# Patient Record
Sex: Female | Born: 1940 | Race: White | Hispanic: No | Smoking: Never smoker
Health system: Southern US, Community
[De-identification: ages and names within clinical notes are randomized; demographics above are authoritative.]

## PROBLEM LIST (undated history)

## (undated) DIAGNOSIS — I2510 Atherosclerotic heart disease of native coronary artery without angina pectoris: Secondary | ICD-10-CM

## (undated) DIAGNOSIS — M069 Rheumatoid arthritis, unspecified: Secondary | ICD-10-CM

## (undated) DIAGNOSIS — Z87440 Personal history of urinary (tract) infections: Secondary | ICD-10-CM

## (undated) DIAGNOSIS — E785 Hyperlipidemia, unspecified: Secondary | ICD-10-CM

## (undated) DIAGNOSIS — F32A Depression, unspecified: Secondary | ICD-10-CM

## (undated) DIAGNOSIS — I10 Essential (primary) hypertension: Secondary | ICD-10-CM

## (undated) DIAGNOSIS — I509 Heart failure, unspecified: Secondary | ICD-10-CM

## (undated) DIAGNOSIS — I219 Acute myocardial infarction, unspecified: Secondary | ICD-10-CM

## (undated) DIAGNOSIS — F329 Major depressive disorder, single episode, unspecified: Secondary | ICD-10-CM

## (undated) DIAGNOSIS — I255 Ischemic cardiomyopathy: Secondary | ICD-10-CM

## (undated) DIAGNOSIS — F419 Anxiety disorder, unspecified: Secondary | ICD-10-CM

## (undated) DIAGNOSIS — R1310 Dysphagia, unspecified: Secondary | ICD-10-CM

## (undated) DIAGNOSIS — N159 Renal tubulo-interstitial disease, unspecified: Secondary | ICD-10-CM

## (undated) DIAGNOSIS — D649 Anemia, unspecified: Secondary | ICD-10-CM

## (undated) HISTORY — DX: Heart failure, unspecified: I50.9

## (undated) HISTORY — DX: Ischemic cardiomyopathy: I25.5

## (undated) HISTORY — DX: Personal history of urinary (tract) infections: Z87.440

## (undated) HISTORY — DX: Anxiety disorder, unspecified: F41.9

## (undated) HISTORY — DX: Atherosclerotic heart disease of native coronary artery without angina pectoris: I25.10

## (undated) HISTORY — DX: Renal tubulo-interstitial disease, unspecified: N15.9

## (undated) HISTORY — DX: Major depressive disorder, single episode, unspecified: F32.9

## (undated) HISTORY — DX: Hyperlipidemia, unspecified: E78.5

## (undated) HISTORY — DX: Essential (primary) hypertension: I10

## (undated) HISTORY — DX: Acute myocardial infarction, unspecified: I21.9

## (undated) HISTORY — PX: FOOT SURGERY: SHX648

## (undated) HISTORY — DX: Anemia, unspecified: D64.9

## (undated) HISTORY — DX: Dysphagia, unspecified: R13.10

## (undated) HISTORY — DX: Rheumatoid arthritis, unspecified: M06.9

## (undated) HISTORY — DX: Depression, unspecified: F32.A

---
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
## 2008-10-29 ENCOUNTER — Ambulatory Visit: Payer: Self-pay | Admitting: Family Medicine

## 2008-11-19 ENCOUNTER — Encounter: Payer: Self-pay | Admitting: Cardiology

## 2009-06-22 ENCOUNTER — Ambulatory Visit: Payer: Self-pay | Admitting: Cardiology

## 2009-06-22 DIAGNOSIS — I10 Essential (primary) hypertension: Secondary | ICD-10-CM | POA: Insufficient documentation

## 2009-06-28 ENCOUNTER — Ambulatory Visit: Payer: Self-pay

## 2009-06-28 ENCOUNTER — Other Ambulatory Visit: Payer: Self-pay | Admitting: Cardiology

## 2009-06-28 ENCOUNTER — Encounter: Payer: Self-pay | Admitting: Cardiology

## 2009-06-28 ENCOUNTER — Ambulatory Visit: Payer: Self-pay | Admitting: Cardiology

## 2009-06-28 ENCOUNTER — Telehealth: Payer: Self-pay | Admitting: Cardiology

## 2009-07-12 ENCOUNTER — Ambulatory Visit: Payer: Self-pay | Admitting: Cardiology

## 2009-07-28 ENCOUNTER — Encounter: Payer: Self-pay | Admitting: Cardiovascular Disease

## 2009-09-08 ENCOUNTER — Ambulatory Visit: Payer: Self-pay | Admitting: Cardiovascular Disease

## 2009-09-12 ENCOUNTER — Telehealth: Payer: Self-pay | Admitting: Cardiovascular Disease

## 2009-09-28 ENCOUNTER — Ambulatory Visit: Payer: Self-pay | Admitting: Cardiovascular Disease

## 2009-09-28 DIAGNOSIS — E785 Hyperlipidemia, unspecified: Secondary | ICD-10-CM

## 2009-11-15 ENCOUNTER — Ambulatory Visit: Payer: Self-pay | Admitting: Cardiovascular Disease

## 2009-11-16 ENCOUNTER — Telehealth: Payer: Self-pay | Admitting: Cardiovascular Disease

## 2009-12-09 ENCOUNTER — Telehealth: Payer: Self-pay | Admitting: Cardiovascular Disease

## 2009-12-13 ENCOUNTER — Ambulatory Visit: Payer: Self-pay | Admitting: Cardiovascular Disease

## 2009-12-20 ENCOUNTER — Encounter: Payer: Self-pay | Admitting: Cardiovascular Disease

## 2009-12-28 ENCOUNTER — Encounter: Payer: Self-pay | Admitting: Cardiovascular Disease

## 2009-12-28 ENCOUNTER — Telehealth: Payer: Self-pay | Admitting: Cardiovascular Disease

## 2009-12-30 ENCOUNTER — Telehealth: Payer: Self-pay | Admitting: Cardiovascular Disease

## 2010-01-12 ENCOUNTER — Ambulatory Visit: Payer: Self-pay | Admitting: Cardiovascular Disease

## 2010-01-20 ENCOUNTER — Telehealth: Payer: Self-pay | Admitting: Cardiovascular Disease

## 2010-03-26 ENCOUNTER — Encounter: Payer: Self-pay | Admitting: Cardiovascular Disease

## 2010-03-29 ENCOUNTER — Ambulatory Visit: Payer: Self-pay | Admitting: Cardiovascular Disease

## 2010-05-22 ENCOUNTER — Ambulatory Visit: Payer: Self-pay | Admitting: Cardiovascular Disease

## 2010-05-30 ENCOUNTER — Telehealth: Payer: Self-pay | Admitting: Cardiovascular Disease

## 2010-06-08 ENCOUNTER — Telehealth: Payer: Self-pay | Admitting: Cardiovascular Disease

## 2010-07-25 NOTE — Assessment & Plan Note (Signed)
Summary: [facility]   Visit Type:  Follow-up Referring Provider:  wall Primary Provider:  Dr Elease Hashimoto  CC:  been feeling ok, some energy not much, thinks this med makes her feel better than the other medicine. No cp, edema, and or short of breath. .  History of Present Illness: Melody Garrison returns today for evaluation management of her difficult to control hypertension and intolerance to multiple medications.  Previous echo showing  normal left ventricular systolic function, no segmental wall motion abnormalities, moderate LVH, mild mitral regurgitation, and mild left atrial enlargement.   Previously, on her last visit, Melody. Toren had stopped her diltiazem. Her pressures were severely elevated. She's had difficulty tolerating many blood pressure medications. We started her on clonidine 0.1 mg with titration up to 0.2 mg. We have continued her on triamterene HCTZ.  She presents today and states that she has not been taking the triamterene HCTZ as it makes her mouth dry. Her clonidine she has kept at 0.1 mg b.i.d. Blood pressure at home has been typically within the 120-140 range. Other than a dry mouth, she feels well with no significant side effects. She is very happy as she's had difficulty tolerating other medications.  Current Problems (verified): 1)  Hypertension, Unspecified  (ICD-401.9)  Current Medications (verified): 1)  Triamterene-Hctz 37.5-25 Mg Tabs (Triamterene-Hctz) .... 1/2  Tab By Mouth Once Daily - Not Taking Currently 2)  Centrum Silver  Tabs (Multiple Vitamins-Minerals) .Marland Kitchen.. 1 By Mouth Once Daily - On Hold 3)  Bayer Aspirin 325 Mg Tabs (Aspirin) .Marland Kitchen.. 1 By Mouth As Needed For Arthritis 4)  Clonidine Hcl 0.2 Mg Tabs (Clonidine Hcl) .... 1/2  Tablet By Mouth Twice A Day  Allergies (verified): 1)  ! * Anti Inflamatories 2)  ! * Metoprolol 3)  ! * Amlodipine 4)  ! * Lisinopril 5)  ! * Bystolic 6)  ! Diovan  Past History:  Past Medical History: Last updated:  06-27-2009 Anxiety Arthritis Depression Eye Ulcers Hypertension (April 2010) kidney infections  Past Surgical History: Last updated: 27-Jun-2009 foot surgery  Family History: Last updated: June 27, 2009 Father: deceased 47; MI Mother: deceased 71; MI first one at age 30 Brother: heart problems  Social History: Last updated: 06/27/09 Retired  Widowed  Tobacco Use - No.  Alcohol Use - no Regular Exercise - no Drug Use - no  Risk Factors: Alcohol Use: 0 (2009-06-27) Caffeine Use: yes 2 cups a day (06-27-09) Exercise: no (27-Jun-2009)  Risk Factors: Smoking Status: never (06-27-09)  Review of Systems  The patient denies fever, weight loss, weight gain, vision loss, decreased hearing, hoarseness, chest pain, syncope, dyspnea on exertion, peripheral edema, prolonged cough, abdominal pain, incontinence, muscle weakness, depression, and enlarged lymph nodes.         Dry mouth  Vital Signs:  Patient profile:   70 year old female Height:      65 inches Weight:      150.50 pounds BMI:     25.14 Pulse rate:   70 / minute Pulse rhythm:   regular BP sitting:   118 / 72  (left arm) Cuff size:   regular  Vitals Entered By: Melody Garrison (September 28, 2009 10:16 AM)  Physical Exam  General:  well-appearing  woman in no apparent distress, HEENT exam is benign, oropharynx is clear, neck is supple with no JVP or carotid bruits, heart sounds are regular S1 and S2 and no murmurs appreciated, lungs are clear to auscultation bilaterally, abdominal exam is benign, she  has no  lower extremity edema, pulses are equal and symmetrical in upper and lower extremities, skin is warm and dry. Neurologic exam is grossly nonfocal.   Impression & Recommendations:  Problem # 1:  HYPERTENSION, UNSPECIFIED (ICD-401.9) her blood pressure is well controlled on today's visit and in a reasonable range based on her home measurements. We have encouraged her to continue on the clonidine 0.1 mg  b.i.d. and monitor her blood pressure at home. She will call us or Dr. Elease Hashimoto if it starts trending upward. If she does have elevated measurement that stays elevated, she could take an additional 0.1 mg tablet that day.   Her updated medication list for this problem includes:    Triamterene-hctz 37.5-25 Mg Tabs (Triamterene-hctz) .Marland Kitchen... 1/2  tab by mouth once daily - not taking currently    Bayer Aspirin 325 Mg Tabs (Aspirin) .Marland Kitchen... 1 by mouth as needed for arthritis    Clonidine Hcl 0.2 Mg Tabs (Clonidine hcl) .Marland Kitchen... 1/2  tablet by mouth twice a day  Problem # 2:  HEALTH SCREENING (ICD-V70.0) She is currently on aspirin daily. She does not smoke.  Problem # 3:  HYPERLIPIDEMIA-MIXED (ICD-272.4) She does have high cholesterol based on recent blood work provided by Dr. Dorthy Cooler from February. Her cholesterol is 226, LDL 137, HDL 71. Vitamin D is low at 22.  We talked with her about starting a low-dose generic cholesterol medication. She would like to wait at this time she has had significant problems with muscle ache/myalgia. We will discuss with her on her next visit.

## 2010-07-25 NOTE — Assessment & Plan Note (Signed)
Summary: F1M/AMD   Visit Type:  Follow-up Referring Provider:  wall Primary Provider:  Dr Elease Hashimoto  CC:  "Doing well"..  History of Present Illness: Ms Kingbird returns today for evaluation management of her difficult to control hypertension and intolerance to multiple medications.  she was recently seen at [facility] with referral to Dr. Luciana Axe for a retro-retinal bleed. She lost part of her vision thought secondary to her severe hypertension. She has taken herself off numerous other medications including metoprolol, bystolic, lisinopril, HCTZ, Diovan. we had started her on Valturna 300/320 mg and she has been able to tolerate this with no problems. Her pressures continued to be elevated and we had suggested over the phone she continue with clonidine 0.1 mg b.i.d.  Today she reports that she has taken herself off clonidine. She reports blood pressures at home that were in the 130s to 140s systolic. Today her pressure in the office is well above that. She is quite surprised. She is not taking any of her anxiety medicines. She is nervous about what the side effects might be.  Previous echo showing  normal left ventricular systolic function, no segmental wall motion abnormalities, moderate LVH, mild mitral regurgitation, and mild left atrial enlargement.   Current Medications (verified): 1)  Bayer Aspirin 325 Mg Tabs (Aspirin) .Marland Kitchen.. 1 By Mouth As Needed For Arthritis 2)  Valturna 300-320 Mg Tabs (Aliskiren-Valsartan) .... Take 1 Tablet By Mouth Once A Day 3)  Centrum Silver  Tabs (Multiple Vitamins-Minerals) .Marland Kitchen.. 1 Once Daily  Allergies (verified): 1)  ! * Anti Inflamatories 2)  ! * Metoprolol 3)  ! * Amlodipine 4)  ! * Lisinopril 5)  ! * Bystolic 6)  ! Diovan  Past History:  Past Medical History: Last updated: July 06, 2009 Anxiety Arthritis Depression Eye Ulcers Hypertension (April 2010) kidney infections  Past Surgical History: Last updated: 07-06-09 foot  surgery  Family History: Last updated: Jul 06, 2009 Father: deceased 66; MI Mother: deceased 72; MI first one at age 39 Brother: heart problems  Social History: Last updated: 06-Jul-2009 Retired  Widowed  Tobacco Use - No.  Alcohol Use - no Regular Exercise - no Drug Use - no  Risk Factors: Alcohol Use: 0 (Jul 06, 2009) Caffeine Use: yes 2 cups a day (07-06-09) Exercise: no (07-06-09)  Risk Factors: Smoking Status: never (07/06/09)  Review of Systems  The patient denies fever, weight loss, weight gain, vision loss, decreased hearing, hoarseness, chest pain, syncope, dyspnea on exertion, peripheral edema, prolonged cough, abdominal pain, incontinence, muscle weakness, depression, and enlarged lymph nodes.         arthritis  Vital Signs:  Patient profile:   70 year old female Height:      65 inches Weight:      148 pounds BMI:     24.72 Pulse rate:   83 / minute BP sitting:   181 / 84  (left arm) Cuff size:   regular  Vitals Entered By: Bishop Dublin, CMA (January 12, 2010 10:17 AM)  Serial Vital Signs/Assessments:  Time      Position  BP       Pulse  Resp  Temp     By 10:28 AM            182/80                         Bishop Dublin, CMA   Physical Exam  General:  Well developed, well nourished, in no acute distress. Head:  normocephalic and atraumatic Neck:  Neck supple, no JVD. No masses, thyromegaly or abnormal cervical nodes. Lungs:  Clear bilaterally to auscultation and percussion. Heart:  Non-displaced PMI, chest non-tender; regular rate and rhythm, S1, S2 without murmurs, rubs or gallops. Carotid upstroke normal, no bruit. Pedals normal pulses. No edema, no varicosities. Abdomen:  Bowel sounds positive; abdomen soft and non-tender without masses, organomegaly, or hernias noted. No hepatosplenomegaly. Msk:  Back normal, normal gait. Muscle strength and tone normal. 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 Pulses:  pulses normal in all 4 extremities Extremities:   No clubbing or cyanosis. Neurologic:  Alert and oriented x 3. Skin:  Intact without lesions or rashes. Psych:  Normal affect.   Impression & Recommendations:  Problem # 1:  HYPERTENSION, UNSPECIFIED (ICD-401.9) blood pressure is very elevated in the office and again she attributes this to white coat syndrome. We have asked her to closely monitor her blood pressure at home who also test her blood pressure at pharmacies around the area. If her blood pressure is greater than 150 systolic, she is to take hydralazine 50 mg p.r.n.  The following medications were removed from the medication list:    Clonidine Hcl 0.1 Mg Tabs (Clonidine hcl) .Marland Kitchen... Take 1 tablet by mouth once a day as needed for bp greater than 150 on top number Her updated medication list for this problem includes:    Bayer Aspirin 325 Mg Tabs (Aspirin) .Marland Kitchen... 1 by mouth as needed for arthritis    Valturna 300-320 Mg Tabs (Aliskiren-valsartan) .Marland Kitchen... Take 1 tablet by mouth once a day    Hydralazine Hcl 50 Mg Tabs (Hydralazine hcl) .Marland Kitchen... Take one tablet by mouth three times a day as needed for bp greater than 150/90  Orders: EKG w/ Interpretation (93000)  Problem # 2:  HYPERLIPIDEMIA-MIXED (ICD-272.4) We'll try to obtain her most recent lipid panel for our records. She is on aspirin. She is a nonsmoker.  Patient Instructions: 1)  Your physician has recommended you make the following change in your medication: START hydralazine 50mg  three times a day as needed for BP greater than 150/90 2)  Your physician wants you to follow-up in:  6 months  You will receive a reminder letter in the mail two months in advance. If you don't receive a letter, please call our office to schedule the follow-up appointment. 3)  Your physician has requested that you regularly monitor and record your blood pressure readings at home.  Please use the same machine at the same time of day to check your readings and call us with the results in 1 week.   Prescriptions: VALTURNA 300-320 MG TABS (ALISKIREN-VALSARTAN) Take 1 tablet by mouth once a day  #35 x 0   Entered by:   Benedict Needy, RN   Authorized by:   Dossie Arbour MD   Signed by:   Dossie Arbour MD on 01/12/2010   Method used:   Samples Given   RxID:   [id number] HYDRALAZINE HCL 50 MG TABS (HYDRALAZINE HCL) Take one tablet by mouth three times a day as needed for BP greater than 150/90  #90 x 4   Entered by:   Benedict Needy, RN   Authorized by:   Dossie Arbour MD   Signed by:   Benedict Needy, RN on 01/12/2010   Method used:   [facility]ically to        [facility]  [address] [phone][facility] (retail)       [address]  [place], [place]  [address]       Ph: [phone]       Fax: [phone]   RxID:   [id number]

## 2010-07-25 NOTE — Progress Notes (Signed)
Summary: BP check  Phone Note Other Incoming   Summary of Call: BP check: 147/71, 163/66, 146/69, 148/70, 143/71 Initial call taken by: Charlena Cross, RN, BSN,  June 28, 2009 12:31 PM     Appended Document: BP check No change in meds.  Reviewed Juanito Doom, MD

## 2010-07-25 NOTE — Assessment & Plan Note (Signed)
Summary: [facility]   Visit Type:  rov Referring Provider:  wall Primary Provider:  Dr Elease Hashimoto  CC:  no complaints, diltiazem it has done such a number on her head, and she hasn't taken it for the pass 3-4 days - made her vision blurry.  she just couldn't function on the medication. Marland Kitchen  History of Present Illness: Ms Rottman returns today for evaluation management of her difficult to control hypertension and intolerance to multiple medications.  Because of abnormal EKG, previous abnormal echo, and poorly controlled hypertension, we obtained an echocardiogram shows normal left ventricular systolic function, no segmental wall motion abnormalities, moderate LVH, mild mitral regurgitation, and mild left atrial enlargement.  Ms. Wynn Banker stopped taking her diltiazem 3-4 days ago. She also cut her diuretic medication in half. She felt that it was doing something funny with her head, causing blurry vision and headaches. She states that she could not function on the medication. She reports having blood pressures in the 150s systolic at home and was surprised that her pressure was still elevated on today's visit. She states that she feels well today and wants to go out to lunch with her friends.  Current Problems (verified): 1)  Hypertension, Unspecified  (ICD-401.9)  Current Medications (verified): 1)  Triamterene-Hctz 37.5-25 Mg Tabs (Triamterene-Hctz) .... 1/2  Tab By Mouth Once Daily 2)  Centrum Silver  Tabs (Multiple Vitamins-Minerals) .Marland Kitchen.. 1 By Mouth Once Daily - On Hold 3)  Bayer Aspirin 325 Mg Tabs (Aspirin) .Marland Kitchen.. 1 By Mouth As Needed For Arthritis  Allergies (verified): 1)  ! * Anti Inflamatories 2)  ! * Metoprolol 3)  ! * Amlodipine 4)  ! * Lisinopril 5)  ! * Bystolic 6)  ! Diovan  Past History:  Past Medical History: Last updated: 07-16-09 Anxiety Arthritis Depression Eye Ulcers Hypertension (April 2010) kidney infections  Past Surgical History: Last updated:  07-16-2009 foot surgery  Family History: Last updated: 16-Jul-2009 Father: deceased 30; MI Mother: deceased 52; MI first one at age 6 Brother: heart problems  Social History: Last updated: 16-Jul-2009 Retired  Widowed  Tobacco Use - No.  Alcohol Use - no Regular Exercise - no Drug Use - no  Risk Factors: Alcohol Use: 0 (Jul 16, 2009) Caffeine Use: yes 2 cups a day (2009/07/16) Exercise: no (2009/07/16)  Risk Factors: Smoking Status: never (2009/07/16)  Review of Systems  The patient denies fever, weight loss, weight gain, vision loss, decreased hearing, hoarseness, chest pain, syncope, dyspnea on exertion, peripheral edema, prolonged cough, abdominal pain, incontinence, muscle weakness, depression, and enlarged lymph nodes.         arthritis pain in hands and legs  Vital Signs:  Patient profile:   70 year old female Height:      65 inches Weight:      149 pounds BMI:     24.88 Pulse rate:   84 / minute Pulse rhythm:   regular BP sitting:   220 / 92  (left arm) Cuff size:   regular  Vitals Entered By: Mercer Pod (September 08, 2009 11:25 AM)  Physical Exam  General:  well-appearing elderly woman in no apparent distress, HEENT exam is benign, neck is supple with no JVP or carotid bruits, heart sounds are regular with S1-S2 and no murmurs appreciated, lungs are clear to auscultation with no wheezes or rales, abdominal exam is benign, no significant lower extremity edema, neurologic exam is grossly nonfocal, skin is warm and dry.   Impression & Recommendations:  Problem # 1:  HYPERTENSION,  UNSPECIFIED (ICD-401.9) blood pressure is very elevated with systolics in the 200 range on recheck. She is asymptomatic. She seems to change her medications very frequently and has discontinued her calcium channel blocker and only takes half the HCTZ.  I've encouraged her to call us when she does change her medications on her own. She is reluctant to restart her diltiazem. He  notes indicate having problems with metoprolol, amlodipine, lisinopril, bystolic, and Diovan. Most likely her medication side effects or not appropriate as she seems unable to her unwilling to take medications in general.  We will start her on clonidine 0.1 mg b.i.d. and if she is able to tolerate this, titrate the dose to 0.2 mg b.i.d. We'll see her back in 2 weeks time. If she is unable to tolerate clonidine, we could try hydralazine.   The following medications were removed from the medication list:    Diltiazem Hcl Cr 180 Mg Xr24h-cap (Diltiazem hcl) .Marland Kitchen... 1 tab by mouth daily Her updated medication list for this problem includes:    Triamterene-hctz 37.5-25 Mg Tabs (Triamterene-hctz) .Marland Kitchen... 1/2  tab by mouth once daily    Bayer Aspirin 325 Mg Tabs (Aspirin) .Marland Kitchen... 1 by mouth as needed for arthritis    Clonidine Hcl 0.2 Mg Tabs (Clonidine hcl) .Marland Kitchen... Take one tablet by mouth twice a day  Patient Instructions: 1)  Your physician recommends that you schedule a follow-up appointment in: 2 week 2)  Your physician has recommended you make the following change in your medication: clonidine 0.2 mg twice a day (start by taking 1/2 tablet) Prescriptions: CLONIDINE HCL 0.2 MG TABS (CLONIDINE HCL) Take one tablet by mouth twice a day  #60 x 3   Entered by:   Charlena Cross, RN, BSN   Authorized by:   Dossie Arbour MD   Signed by:   Charlena Cross, RN, BSN on 09/08/2009   Method used:   [facility] to        [phone][facility] (retail)       [address]       [place], [place]  [address]       Ph: [phone]       Fax: [phone]   RxID:   [id number]

## 2010-07-25 NOTE — Assessment & Plan Note (Signed)
Summary: ROV/GLC   Visit Type:  Follow-up Referring Provider:  wall Primary Provider:  Dr Elease Hashimoto  CC:  c/o being nervous with some of medications.  She is depressed, dizzy, diarrhea, and indigestion and muscle aches..  History of Present Illness: Ms Dlugosz returns today for evaluation management of her difficult to control hypertension and intolerance to multiple medications, also with history of rheumatoid arthritis. 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 She has taken herself off numerous other medications including metoprolol, bystolic, lisinopril, HCTZ, Diovan. we has been maintained on Valturna 300/320 mg and she has been able to tolerate this with no problems.  she presents today and reports having significant anxiety. Her aunt has been sleeping with her as she is very nervous. She notices that her gait is unstable and wonders if it might be the medication. She would like to try a half dose of her medication to see if this helps. She typically states that at home her blood pressure is well controlled and reports numbers in the 120s to 140 range systolic with diastolics in the 60s to 70s. She reports having this checked at facilities outside of her house as well as with her home blood pressure cuff.  she does have trouble walking due to severe foot pain. She is unable to exercise to any degree. She also has significant hand pain secondary to her rheumatoid arthritis.  Typically in our office, her blood pressure is never well controlled though she reports it is because she is always nervous.  Previous echo showing  normal left ventricular systolic function, no segmental wall motion abnormalities, moderate LVH, mild mitral regurgitation, and mild left atrial enlargement.   Current Medications (verified): 1)  Valturna 300-320 Mg Tabs (Aliskiren-Valsartan) .... Take 1 Tablet By Mouth Once A Day 2)  Centrum Silver  Tabs  (Multiple Vitamins-Minerals) .Marland Kitchen.. 1 Once Daily  Allergies (verified): 1)  ! * Anti Inflamatories 2)  ! * Metoprolol 3)  ! * Amlodipine 4)  ! * Lisinopril 5)  ! * Bystolic 6)  ! Diovan  Past History:  Past Medical History: Last updated: June 25, 2009 Anxiety Arthritis Depression Eye Ulcers Hypertension (April 2010) kidney infections  Past Surgical History: Last updated: 2009-06-25 foot surgery  Family History: Last updated: 06/25/2009 Father: deceased 22; MI Mother: deceased 52; MI first one at age 62 Brother: heart problems  Social History: Last updated: 2009/06/25 Retired  Widowed  Tobacco Use - No.  Alcohol Use - no Regular Exercise - no Drug Use - no  Risk Factors: Alcohol Use: 0 (06/25/09) Caffeine Use: yes 2 cups a day (06-25-2009) Exercise: no (Jun 25, 2009)  Risk Factors: Smoking Status: never (06/25/2009)  Review of Systems       The patient complains of difficulty walking.  The patient denies fever, weight loss, weight gain, vision loss, decreased hearing, hoarseness, chest pain, syncope, dyspnea on exertion, peripheral edema, prolonged cough, abdominal pain, incontinence, muscle weakness, depression, and enlarged lymph nodes.         Hand pain, severe foot pain  Vital Signs:  Patient profile:   70 year old female Height:      65 inches Weight:      153 pounds BMI:     25.55 Pulse rate:   97 / minute BP sitting:   192 / 82  (left arm) Cuff size:   large  Vitals Entered By: Bishop Dublin, CMA (March 29, 2010 3:19 PM)  Serial  Vital Signs/Assessments:  Time      Position  BP       Pulse  Resp  Temp     By                     188/84   92                    Bishop Dublin, CMA   Physical Exam  General:  Well developed, well nourished, in no acute distress. Head:  normocephalic and atraumatic Neck:  Neck supple, no JVD. No masses, thyromegaly or abnormal cervical nodes. Lungs:  Clear bilaterally to auscultation and percussion. Heart:   Non-displaced PMI, chest non-tender; regular rate and rhythm, S1, S2 without murmurs, rubs or gallops. Carotid upstroke normal, no bruit. Pedals normal pulses. No edema, no varicosities. Abdomen:  Bowel sounds positive; abdomen soft and non-tender without masses Msk:  Back normal, normal gait. Muscle strength and tone normal. 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 Pulses:  pulses normal in all 4 extremities Extremities:  No clubbing or cyanosis. Neurologic:  Alert and oriented x 3. Skin:  Intact without lesions or rashes. Psych:  Normal affect.   Impression & Recommendations:  Problem # 1:  HYPERTENSION, UNSPECIFIED (ICD-401.9) she reports well-controlled blood pressures at home and has notes detailing this. She has severe hypertension in the office. She has been reluctant to take any supplemental medications and has problems with almost every medication we tried. As she reports adequate blood pressures, we have not made any changes at this time. Yesterday continue to monitor her blood pressure very closely.  The following medications were removed from the medication list:    Bayer Aspirin 325 Mg Tabs (Aspirin) .Marland Kitchen... 1 by mouth as needed for arthritis    Hydralazine Hcl 50 Mg Tabs (Hydralazine hcl) .Marland Kitchen... Take one tablet by mouth three times a day as needed for bp greater than 150/90 Her updated medication list for this problem includes:    Valturna 300-320 Mg Tabs (Aliskiren-valsartan) .Marland Kitchen... Take 1 tablet by mouth once a day  Problem # 2:  HYPERLIPIDEMIA-MIXED (ICD-272.4) No cholesterol medications have been started as she has significant problems with almost every medication.  Patient Instructions: 1)  Your physician recommends that you continue on your current medications as directed. Please refer to the Current Medication list given to you today. 2)  Your physician wants you to follow-up in:   6 months You will receive a reminder letter in the mail two months in advance. If you don't  receive a letter, please call our office to schedule the follow-up appointment. 3)  Your physician has requested that you regularly monitor and record your blood pressure readings at home. Please call office in 1 week with recordings.   Appended Document: ROV/GLC given her problems with walking, using her hands to drive, I suggested that she use a handicap sticker. She does have signs of mild depression and is very anxious at night. I hope with the sticker, this would encourage her to get out of the house and socialize.

## 2010-07-25 NOTE — Letter (Signed)
Summary: Self-Recorded Vitals & Medical Hx  Self-Recorded Vitals & Medical Hx   Imported By: Marylou Mccoy 05/03/2010 09:31:21  _____________________________________________________________________  External Attachment:    Type:   Image     Comment:   External Document  Appended Document: Self-Recorded Vitals & Medical Hx Blood pressure loks good. Would confirm as well on alternat BP cuff. Other symptoms sound like anxiety ?Marland Kitchen May want to talk with PMD. Need low dose xanax? Hydralazine ok for Blood vessels. Used safely for HTN. Could cut in 1/2 if needed for HTN  Appended Document: Self-Recorded Vitals & Medical Hx Attempted to call pt.  LMOM TCB. EWJ  Appended Document: Self-Recorded Vitals & Medical Hx Called spoke with pt adised of Dr Windell Hummingbird recommendations.  Pt agrees and will continue to monitor BP and call with problems. EWJ

## 2010-07-25 NOTE — Assessment & Plan Note (Signed)
Summary: ROV/AMD   Visit Type:  Follow-up Primary Provider:  Dr Elease Hashimoto  CC:  no cp, no sob, and no edema.  History of Present Illness: Melody Garrison returns today for evaluation management of her difficult to control hypertension and intolerance to multiple medications.  Because of abnormal EKG, previous abnormal echo, and poorly controlled hypertension, we obtained an echocardiogram shows normal left ventricular systolic function, no segmental Delmore Sear motion abnormalities, moderate LVH, mild mitral regurgitation, and mild left atrial enlargement.  I placed her on a low dose of diltiazem extended release and convinced her that she needed her diuretic daily. Her pressures been running around 140/60 at time of heart rates in the 60s. She says she feels better. Lower extremity edema has not been an issue.  Current Problems (verified): 1)  Hypertension, Unspecified  (ICD-401.9)  Current Medications (verified): 1)  Triamterene-Hctz 37.5-25 Mg Tabs (Triamterene-Hctz) .Marland Kitchen.. 1 Tab By Mouth Once Daily 2)  Centrum Silver  Tabs (Multiple Vitamins-Minerals) .Marland Kitchen.. 1 By Mouth Once Daily 3)  Bayer Aspirin 325 Mg Tabs (Aspirin) .Marland Kitchen.. 1 By Mouth As Needed For Arthritis 4)  Diltiazem Hcl Cr 180 Mg Xr24h-Cap (Diltiazem Hcl) .Marland Kitchen.. 1 Tab By Mouth Daily  Allergies (verified): 1)  ! * Anti Inflamatories 2)  ! * Metoprolol 3)  ! * Amlodipine 4)  ! * Lisinopril 5)  ! * Bystolic 6)  ! Diovan  Past History:  Past Medical History: Last updated: 07-17-2009 Anxiety Arthritis Depression Eye Ulcers Hypertension (April 2010) kidney infections  Past Surgical History: Last updated: 07-17-09 foot surgery  Family History: Last updated: Jul 17, 2009 Father: deceased 82; MI Mother: deceased 49; MI first one at age 48 Brother: heart problems  Social History: Last updated: 07-17-2009 Retired  Widowed  Tobacco Use - No.  Alcohol Use - no Regular Exercise - no Drug Use - no  Risk Factors: Alcohol Use: 0  (Jul 17, 2009) Caffeine Use: yes 2 cups a day (07/17/09) Exercise: no (2009/07/17)  Risk Factors: Smoking Status: never (Jul 17, 2009)  Review of Systems       negative other than history of present illness  Vital Signs:  Patient profile:   70 year old female Height:      65 inches Weight:      146.25 pounds BMI:     24.43 Pulse rate:   76 / minute Pulse rhythm:   regular BP sitting:   180 / 86  (right arm) Cuff size:   regular  Vitals Entered By: Mercer Pod (July 12, 2009 11:44 AM)  Physical Exam  General:  Well developed, well nourished, in no acute distress. Head:  normocephalic and atraumatic Eyes:  PERRLA/EOM intact; conjunctiva and lids normal. Neck:  Neck supple, no JVD. No masses, thyromegaly or abnormal cervical nodes. Lungs:  Clear bilaterally to auscultation and percussion. Heart:  Non-displaced PMI, chest non-tender; regular rate and rhythm, S1, S2 without murmurs, rubs or gallops. Carotid upstroke normal, no bruit. Normal abdominal aortic size, no bruits. Femorals normal pulses, no bruits. Pedals normal pulses. No edema, no varicosities. Msk:  decreased ROM.   Pulses:  pulses normal in all 4 extremities Extremities:  trace left pedal edema and trace right pedal edema.   Neurologic:  Alert and oriented x 3. Skin:  Intact without lesions or rashes. Psych:  anxious.     Impression & Recommendations:  Problem # 1:  HYPERTENSION, UNSPECIFIED (ICD-401.9) Assessment Improved her Vent blood pressure has significantly improved range around 140/60 with heart rates in the 60s when she checks it  on. There seems to be a component of whitecoat hypertension. With her left ventricular hypertrophy on the echocardiogram and EKG, it is clear however that her blood pressures been up a lot more than down. With her tolerating diltiazem and a diuretic we'll continue with both. I have made no changes. She'll check her pressure about once or twice a month. I'll see her back  in 6 months. Her updated medication list for this problem includes:    Triamterene-hctz 37.5-25 Mg Tabs (Triamterene-hctz) .Marland Kitchen... 1 tab by mouth once daily    Bayer Aspirin 325 Mg Tabs (Aspirin) .Marland Kitchen... 1 by mouth as needed for arthritis    Diltiazem Hcl Cr 180 Mg Xr24h-cap (Diltiazem hcl) .Marland Kitchen... 1 tab by mouth daily  Patient Instructions: 1)  Your physician recommends that you schedule a follow-up appointment in: 6 months 2)  Your physician recommends that you continue on your current medications as directed. Please refer to the Current Medication list given to you today. Prescriptions: DILTIAZEM HCL CR 180 MG XR24H-CAP (DILTIAZEM HCL) 1 tab by mouth daily  #30 x 6   Entered by:   Charlena Cross, RN, BSN   Authorized by:   Gaylord Shih, MD, [facility]   Signed by:   Charlena Cross, RN, BSN on 07/12/2009   Method used:   Print then Give to Patient   RxID:   [id number]

## 2010-07-25 NOTE — Assessment & Plan Note (Signed)
Summary: ROV/AMD   Visit Type:  Follow-up Referring Provider:  wall Primary Provider:  Dr. Elease Hashimoto  CC:   Denies SOB and chest pain. " I have questions about Valturna".  History of Present Illness: Ms Grassi returns today for evaluation management of her difficult to control hypertension and intolerance to multiple medications, also with history of rheumatoid arthritis. 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 She has taken herself off numerous other medications including metoprolol, bystolic, lisinopril, HCTZ, Diovan. we has been maintained her on Valturna 300/320.  previously, she reported having no problems with valturna. Today, she reports that it is making her anxiety worse. She has significant anxiety when she is at home alone. She has been cutting the pill in half and even into a quarter. She would like to change medication. She wonders if the symptoms could be secondary to the Diovan. she has tried medications for anxiety in the past. she has not been able to tolerate these.  she does have trouble walking due to severe foot pain. She is unable to exercise to any degree. She also has significant hand pain secondary to her rheumatoid arthritis.  Typically in our office, her blood pressure is never well controlled though she reports it is because she is always nervous.   Previous echo showing  normal left ventricular systolic function, no segmental wall motion abnormalities, moderate LVH, mild mitral regurgitation, and mild left atrial enlargement.   Current Medications (verified): 1)  Valturna 300-320 Mg Tabs (Aliskiren-Valsartan) .... Take 1 Tablet By Mouth Once A Day 2)  Centrum Silver  Tabs (Multiple Vitamins-Minerals) .Marland Kitchen.. 1 Once Daily  Allergies (verified): 1)  ! * Anti Inflamatories 2)  ! * Metoprolol 3)  ! * Amlodipine 4)  ! * Lisinopril 5)  ! * Bystolic 6)  ! Diovan  Past History:  Past Medical  History: Last updated: July 01, 2009 Anxiety Arthritis Depression Eye Ulcers Hypertension (April 2010) kidney infections  Past Surgical History: Last updated: 2009-07-01 foot surgery  Family History: Last updated: Jul 01, 2009 Father: deceased 6; MI Mother: deceased 67; MI first one at age 82 Brother: heart problems  Social History: Last updated: 2009-07-01 Retired  Widowed  Tobacco Use - No.  Alcohol Use - no Regular Exercise - no Drug Use - no  Risk Factors: Alcohol Use: 0 (Jul 01, 2009) Caffeine Use: yes 2 cups a day (2009-07-01) Exercise: no (July 01, 2009)  Risk Factors: Smoking Status: never (07-01-2009)  Review of Systems       The patient complains of difficulty walking.  The patient denies fever, weight loss, weight gain, vision loss, decreased hearing, hoarseness, chest pain, syncope, dyspnea on exertion, peripheral edema, prolonged cough, abdominal pain, incontinence, muscle weakness, depression, and enlarged lymph nodes.         anxiety, joint pain  Vital Signs:  Patient profile:   70 year old female Height:      65 inches Weight:      151.50 pounds BMI:     25.30 Pulse rate:   100 / minute BP sitting:   151 / 82  (left arm) Cuff size:   regular  Vitals Entered By: Lysbeth Galas CMA (May 22, 2010 10:57 AM)  Physical Exam  General:  Well developed, well nourished, in no acute distress. disfigurement of the joints of her hands Head:  normocephalic and atraumatic Neck:  Neck supple, no JVD. No masses, thyromegaly or abnormal cervical nodes. Lungs:  Clear bilaterally to auscultation and  percussion. Abdomen:  Bowel sounds positive; abdomen soft and non-tender without masses Msk:  Back normal, normal gait. Muscle strength and tone normal. 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 Pulses:  pulses normal in all 4 extremities Extremities:  No clubbing or cyanosis. Neurologic:  Alert and oriented x 3. Skin:  Intact without lesions or rashes. Psych:  Normal  affect.   Impression & Recommendations:  Problem # 1:  HYPERTENSION, UNSPECIFIED (ICD-401.9) we will change her valturna at her request to Tekturna 300 mg daily. She can use hydralazine p.r.n. if her blood pressure does climb. Estrogen even touch with our office with blood pressure measurements through the next week or so.  Her updated medication list for this problem includes:    Valturna 300-320 Mg Tabs (Aliskiren-valsartan) .Marland Kitchen... Take 1 tablet by mouth once a day hold for now    Tekturna 300 Mg Tabs (Aliskiren fumarate) ..... One tablet once daily  Problem # 2:  HYPERLIPIDEMIA-MIXED (ICD-272.4) We will try to obtain her most recent lipid panel from Dr. Elease Hashimoto.  She is off of aspirin secondary to recent bleeding in her eye. Asked her to contact the ophthalmologist to determine when she can restart her low-dose aspirin.  Patient Instructions: 1)  Your physician recommends that you schedule a follow-up appointment in: 6 months. 2)  Your physician has recommended you make the following change in your medication: Start Tekturna 300 mg one tablet once daily.  Appended Document: ROV/AMD EKG shows normal sinus rhythm with rate 100 beats per minute, rare PVC. No significant ST or T wave changes. Voltage in the limb leads concerning for LVH.  Appended Document: ROV/AMD her heart rate didn't discharge her clinical exam. We will continue to monitor her heart rate. She is intolerant of beta blockers

## 2010-07-25 NOTE — Progress Notes (Signed)
Summary: BP NUMBERS  Phone Note Call from Patient Call back at Home Phone [phone]   Caller: Patient Call For: RN Summary of Call: PATIENT CALLED TO GIVE BP RESULTS THURS MORNING 132/74 AND THURS. AFTERNOON 132/77.  FRIDAY 125/74 Initial call taken by: West Carbo,  December 30, 2009 4:41 PM  Follow-up for Phone Call        looks good per Dr. Mariah Milling Follow-up by: Benedict Needy, RN,  December 30, 2009 5:43 PM

## 2010-07-25 NOTE — Assessment & Plan Note (Signed)
Summary: ROV/GLC   Visit Type:  rov Referring Provider:  wall Primary Provider:  Dr Elease Hashimoto   History of Present Illness: Melody Garrison returns today for evaluation management of her difficult to control hypertension and intolerance to multiple medications.  she states that today she was very nervous because it was suggested that she might need an EKG. She knew that she had an EKG in January and did not need one. She has taken her clonidine today and states that at home it has been well controlled with systolic pressures in the 130 to 140 range.  initial screening blood pressure suggests systolic of 180. She is perplexed as it has been doing so well. She attributed to being nervous.otherwise she feels well and has no complaints of lightheadedness, dizziness, shortness of breath. She realizes that she has a problem with anxiety. She has been given Paxil but has not started it. She has Ativan though rarely takes it and she states that the full dose made her feel too sleepy.  Previous echo showing  normal left ventricular systolic function, no segmental wall motion abnormalities, moderate LVH, mild mitral regurgitation, and mild left atrial enlargement.   Current Medications (verified): 1)  Centrum Silver  Tabs (Multiple Vitamins-Minerals) .Marland Kitchen.. 1 By Mouth Once Daily - On Hold 2)  Bayer Aspirin 325 Mg Tabs (Aspirin) .Marland Kitchen.. 1 By Mouth As Needed For Arthritis 3)  Clonidine Hcl 0.2 Mg Tabs (Clonidine Hcl) .... 1/2  Tablet By Mouth Twice A Day  Allergies: 1)  ! * Anti Inflamatories 2)  ! * Metoprolol 3)  ! * Amlodipine 4)  ! * Lisinopril 5)  ! * Bystolic 6)  ! Diovan  Past History:  Past Medical History: Last updated: 2009/07/17 Anxiety Arthritis Depression Eye Ulcers Hypertension (April 2010) kidney infections  Past Surgical History: Last updated: 07/17/2009 foot surgery  Family History: Last updated: 07/17/2009 Father: deceased 89; MI Mother: deceased 25; MI first one at age  17 Brother: heart problems  Social History: Last updated: 2009/07/17 Retired  Widowed  Tobacco Use - No.  Alcohol Use - no Regular Exercise - no Drug Use - no  Risk Factors: Alcohol Use: 0 (07/17/2009) Caffeine Use: yes 2 cups a day (07-17-09) Exercise: no (07/17/09)  Risk Factors: Smoking Status: never (07-17-09)  Review of Systems  The patient denies fever, weight loss, weight gain, vision loss, decreased hearing, hoarseness, chest pain, syncope, dyspnea on exertion, peripheral edema, prolonged cough, abdominal pain, incontinence, muscle weakness, depression, and enlarged lymph nodes.    Vital Signs:  Patient profile:   70 year old female Height:      65 inches Weight:      151 pounds BMI:     25.22 Pulse rate:   92 / minute Pulse rhythm:   regular BP sitting:   185 / 92  (left arm) Cuff size:   large  Vitals Entered By: Danielle Rankin, CMA (Nov 15, 2009 3:24 PM)  Physical Exam  General:  Well developed, well nourished, in no acute distress. Head:  normocephalic and atraumatic Mouth:  Teeth, gums and palate normal. Oral mucosa normal. Neck:  Neck supple, no JVD. No masses, thyromegaly or abnormal cervical nodes. Lungs:  Clear bilaterally to auscultation and percussion. Heart:  Non-displaced PMI, chest non-tender; regular rate and rhythm, S1, S2 without murmurs, rubs or gallops. Carotid upstroke normal, no bruit. Normal abdominal aortic size, no bruits. Femorals normal pulses, no bruits. Pedals normal pulses. No edema, no varicosities. Abdomen:  Bowel sounds positive; abdomen  soft and non-tender without masses, organomegaly, or hernias noted. No hepatosplenomegaly. Msk:  Back normal, normal gait. Muscle strength and tone normal. Pulses:  pulses normal in all 4 extremities Extremities:  No clubbing or cyanosis. Neurologic:  Alert and oriented x 3. Skin:  Intact without lesions or rashes. Psych:  appears slightly anxious. Otherwise normal affect.    Impression  & Recommendations:  Problem # 1:  HYPERTENSION, UNSPECIFIED (ICD-401.9) once again her blood pressure continues to be a big issue. She is taking clonidine 0.1 mg b.i.d. and states that it has been well controlled at home. Blood pressure today is very elevated though she states this is due to anxiety. If she is in fact having reasonable blood pressures at home as she was on her last visit to the office when she was on clonidine, she may have a significant anxiety problem. I suggested she try Ativan 0.5 mg p.r.n., Toprol immediately and take her clonidine, recheck her pressures 2 hours after that and if it is still elevated with systolics greater than 160, take an additional clonidine. She will call me in the morning after she takes her a.m. clonidine.  If her pressure continues to be elevated, we will start hydralazine 50 mg t.i.d. She's had difficulty tolerating almost all other medications.  The following medications were removed from the medication list:    Triamterene-hctz 37.5-25 Mg Tabs (Triamterene-hctz) .Marland Kitchen... 1/2  tab by mouth once daily - not taking currently Her updated medication list for this problem includes:    Bayer Aspirin 325 Mg Tabs (Aspirin) .Marland Kitchen... 1 by mouth as needed for arthritis    Clonidine Hcl 0.2 Mg Tabs (Clonidine hcl) .Marland Kitchen... 1/2  tablet by mouth twice a day

## 2010-07-25 NOTE — Progress Notes (Signed)
Summary: BP NUMBERS  Phone Note Call from Patient Call back at Home Phone [phone]   Caller: Patient Call For: Nazareth Kirk Summary of Call: PATIENT CALLED WITH WEEKS BP NUMBERS: 143/75, 121/67, 144/72, 142/71, 142/69, AND 143/72 Initial call taken by: West Carbo,  January 20, 2010 2:25 PM  Follow-up for Phone Call        BP's are improved. Please advise.  Follow-up by: Benedict Needy, RN,  January 20, 2010 2:30 PM  Additional Follow-up for Phone Call Additional follow up Details #1::        loks much better. Keep on same meds

## 2010-07-25 NOTE — Consult Note (Signed)
Summary: Consultation Report  Consultation Report   Imported By: West Carbo 12/15/2009 12:20:21  _____________________________________________________________________  External Attachment:    Type:   Image     Comment:   External Document

## 2010-07-25 NOTE — Progress Notes (Signed)
Summary: HEMORRAGE IN EYE  Phone Note Call from Patient Call back at [phone]   Caller: TIM (BROTHER) Call For: [facility] Summary of Call: PT HAS A HEMORRAGE IN HER EYE-WOULD LIKE TO KNOW IF SHE CAN BE SEEN TODAY Initial call taken by: Harlon Flor,  December 09, 2009 11:09 AM  Follow-up for Phone Call        Attempted to call back.  LMOM TCB. Cloyde Reams RN  December 09, 2009 2:42 PM   Spoke with pt's brother Jorja Loa.  Pt went to opthamologist today had eye examined and has had a significant bleed which the eye dr felt was secondary to elevated BP.  BP today in MD office was 186/96.  Attempted to call pt to find out what BP meds pt is currently on.  # busy.  Follow-up by: Cloyde Reams RN,  December 09, 2009 3:14 PM  Additional Follow-up for Phone Call Additional follow up Details #1::        Spoke with pt, she has been taking Clonidine 0.2mg  1/2 tablet two times a day, pt states Dr Mariah Milling advised her she could incr clonidine to 0.2mg  1 tablet two times a day, and pt states she is going to do this today.  Pt will continue to monitor BP and call back with elevation.  Pt cannot drive at present per eye MD, but made pt f/u appt on 12/28/09 per pt request to f/u on BP. Additional Follow-up by: Cloyde Reams RN,  December 09, 2009 3:43 PM

## 2010-07-25 NOTE — Progress Notes (Signed)
Summary: Office Visit  Office Visit   Imported By: West Carbo 12/20/2009 11:36:05  _____________________________________________________________________  External Attachment:    Type:   Image     Comment:   External Document

## 2010-07-25 NOTE — Letter (Signed)
Summary: Historic Patient File  Historic Patient File   Imported By: West Carbo 12/28/2009 16:27:57  _____________________________________________________________________  External Attachment:    Type:   Image     Comment:   External Document  Appended Document: Historic Patient File pt has bladder infection and since starting abx BP has improved.  Asked pt to take BP's today and tomorrow and call me tomorrow afternoon.  Per Dr. Mariah Milling pt can take clonidine 0.1 mg two times a day as needed for BP >140/90

## 2010-07-25 NOTE — Progress Notes (Signed)
Summary: BP READINGS  Phone Note Call from Patient Call back at Home Phone [phone]   Caller: SELF Call For: [facility] Summary of Call: BP READINGS: 3-18 139/61;  3-19 120/55;  3-20 158/69;  3-21  143/57 Initial call taken by: Harlon Flor,  September 12, 2009 1:48 PM  Follow-up for Phone Call        informed pt that BP readings were good.  pt to call back if BP begins trending upward.   Follow-up by: Charlena Cross, RN, BSN,  September 12, 2009 1:51 PM

## 2010-07-25 NOTE — Progress Notes (Signed)
Summary: REFILL Valturna  Phone Note Refill Request   Refills Requested: Medication #1:  VALTURNA 300-320 MG TABS Take 1 tablet by mouth once a day. [facility]     Prescriptions: VALTURNA 300-320 MG TABS (ALISKIREN-VALSARTAN) Take 1 tablet by mouth once a day  #30 x 3   Entered by:   Bishop Dublin, CMA   Authorized by:   Dossie Arbour MD   Signed by:   Bishop Dublin, CMA on 12/28/2009   Method used:   Electronically to        [facility] [phone]* (retail)       [address]       [place], [place]  [address]       Ph: [phone]       Fax: [phone]   RxID:   [id number] VALTURNA 300-320 MG TABS (ALISKIREN-VALSARTAN) Take 1 tablet by mouth once a day  #30 x 3   Entered by:   Bishop Dublin, CMA   Authorized by:   Dossie Arbour MD   Signed by:   Bishop Dublin, CMA on 12/28/2009   Method used:   Electronically to        [facility] [address] (retail)       [address]       [place], [place]  [address]       Ph: [phone]       Fax: [phone]   RxID:   [id number]   Appended Document: REFILL Colleen Can with [facility] to cancel refill on Valturna she is getting [facility]

## 2010-07-25 NOTE — Progress Notes (Signed)
Summary: BP READING  Phone Note Call from Patient Call back at [phone]   Caller: SELF Call For: [facility] Summary of Call: BP IS 158/77 AND PULSE IS 80-PT HAS NOT TAKEN ANY EXTRA MEDICINE TODAY-DOES SHE NEED TO? Initial call taken by: Harlon Flor,  Nov 16, 2009 4:18 PM  Follow-up for Phone Call        Dr Mariah Milling aware of BP, better than when she was in office, but still a little high.  Pt's BP cuff is at her aunt's house so she cannot check tonight.  But will check again tomorrow, and rest prior to taking.  Pt states she did a lot of running around today prior to checking her BP.  Pt will call back tomorrow with results.  Per Dr Mariah Milling if still elevated may need to incr dosage of Clonidine pt is currently taking.   Follow-up by: Cloyde Reams RN,  Nov 16, 2009 5:53 PM  Additional Follow-up for Phone Call Additional follow up Details #1::        Pt called back today and BP 135/68 HR 66. Looks much better. Cloyde Reams RN  Nov 17, 2009 12:00 PM      Appended Document: BP READING Called apoke with pt advised to continue to monitor BP,  and call back if elevates and stays elevated. EWJ

## 2010-07-25 NOTE — Progress Notes (Signed)
Summary: BP Readings  Phone Note Call from Patient Call back at Home Phone [phone]   Caller: Self Call For: [place] Summary of Call: 11-30 116/56; 12-02 143/77; 12-03 147/73; 12-05 120/60 Initial call taken by: Harlon Flor,  May 30, 2010 1:08 PM  Follow-up for Phone Call        Called spoke with pt, advised BP results look good.  Pt states she is feeling well, no complaints.  Will forward results to Dr Mariah Milling for review.  Follow-up by: Cloyde Reams RN,  June 01, 2010 10:02 AM  Additional Follow-up for Phone Call Additional follow up Details #1::        Very nice numbers

## 2010-07-25 NOTE — Assessment & Plan Note (Signed)
Summary:  Cardiology   Visit Type:  Follow-up Referring Provider:  wall Primary Provider:  Dr Elease Hashimoto  CC:  Hemmorrhage behind left eye and elevated BP.  History of Present Illness: Melody Garrison returns today for evaluation management of her difficult to control hypertension and intolerance to multiple medications.  she was recently seen at [facility] with referral to Dr. Luciana Axe for a retro-retinal bleed. She lost part of her vision and continues to have floaters and haziness. The suspicion is that this could have been secondary to her severe hypertension. She has been taking clonidine 0.1 mg b.i.d. but has been reluctant to take any additional medications were higher doses for various reasons. She has taken herself off numerous other medications including metoprolol, bystolic, lisinopril, HCTZ, Diovan.  Her clonidine was increased to 0.2 mg b.i.d. after her vision problems and she reports her blood pressure has been better controlled though she states that she would like to change the medication as it makes her nervous with significant anxiety. She's had the same problem with several other medications. She does not take the clonazepam or Paxil at Dr. Elease Hashimoto had prescribed for her. She is nervous about what the side effects might be.  Previous echo showing  normal left ventricular systolic function, no segmental wall motion abnormalities, moderate LVH, mild mitral regurgitation, and mild left atrial enlargement.   Current Medications (verified): 1)  Bayer Aspirin 325 Mg Tabs (Aspirin) .Marland Kitchen.. 1 By Mouth As Needed For Arthritis 2)  Clonidine Hcl 0.2 Mg Tabs (Clonidine Hcl) .Marland Kitchen.. 1 Tablet Two Times A Day  Allergies (verified): 1)  ! * Anti Inflamatories 2)  ! * Metoprolol 3)  ! * Amlodipine 4)  ! * Lisinopril 5)  ! * Bystolic 6)  ! Diovan  Past History:  Past Medical History: Last updated: 07-10-2009 Anxiety Arthritis Depression Eye Ulcers Hypertension (April  2010) kidney infections  Past Surgical History: Last updated: 07/10/09 foot surgery  Family History: Last updated: 10-Jul-2009 Father: deceased 65; MI Mother: deceased 55; MI first one at age 60 Brother: heart problems  Social History: Last updated: 07-10-2009 Retired  Widowed  Tobacco Use - No.  Alcohol Use - no Regular Exercise - no Drug Use - no  Risk Factors: Alcohol Use: 0 (10-Jul-2009) Caffeine Use: yes 2 cups a day (07/10/2009) Exercise: no (10-Jul-2009)  Risk Factors: Smoking Status: never (Jul 10, 2009)  Review of Systems  The patient denies fever, weight loss, weight gain, vision loss, decreased hearing, hoarseness, chest pain, syncope, dyspnea on exertion, peripheral edema, prolonged cough, abdominal pain, incontinence, muscle weakness, depression, and enlarged lymph nodes.         Vision problems, haziness/floaters, anxiety  Vital Signs:  Patient profile:   70 year old female Height:      65 inches Weight:      151 pounds BMI:     25.22 Pulse rate:   68 / minute BP sitting:   130 / 76  (left arm) Cuff size:   regular  Vitals Entered By: Bishop Dublin, CMA (December 13, 2009 9:50 AM)  Physical Exam  General:  Well developed, well nourished, in no acute distress. Head:  normocephalic and atraumatic Neck:  Neck supple, no JVD. No masses, thyromegaly or abnormal cervical nodes. Lungs:  Clear bilaterally to auscultation and percussion. Heart:  Non-displaced PMI, chest non-tender; regular rate and rhythm, S1, S2 without murmurs, rubs or gallops. Carotid upstroke normal, no bruit.  Pedals normal pulses. No edema, no varicosities. Abdomen:  Bowel sounds positive;  abdomen soft and non-tender without masses Msk:  Back normal, normal gait. Muscle strength and tone normal. Pulses:  pulses normal in all 4 extremities Extremities:  No clubbing or cyanosis. Neurologic:  Alert and oriented x 3. Skin:  Intact without lesions or rashes. Psych:  Normal  affect.   Impression & Recommendations:  Problem # 1:  HYPERTENSION, UNSPECIFIED (ICD-401.9) she's had significant reluctance to being on various medications for her blood pressure. I think she may realize that hypertension will cause significant small vessel injury such as to her eyes. I have stressed to her the importance that she would need to be on medications for her blood pressure. She does not want to be on clonidine although her blood pressure is well controlled today and she reports it makes her anxious.  At her request, we will try to change the medication to an alternate medication though I suspect that her anxiety will be best treated with clonazepam her Paxil as given by Dr. Elease Hashimoto. I've mentioned this to her and encouraged her to try to clonazepam particularly if her blood pressure is elevated.  For her blood pressure, we are running out of shins. We will try valturna 300/320 mg daily with a titration over several days as she comes off of clonidine and onto the valturna. Have asked her to contact me over the next 2 weeks and track her blood pressures. She is to call me before she runs out of samples and we will provide her with a prescription. If this does not work, we could use hydralazine t.i.d.  The following medications were removed from the medication list:    Clonidine Hcl 0.2 Mg Tabs (Clonidine hcl) .Marland Kitchen... 1 tablet two times a day Her updated medication list for this problem includes:    Bayer Aspirin 325 Mg Tabs (Aspirin) .Marland Kitchen... 1 by mouth as needed for arthritis    Clonidine Hcl 0.1 Mg Tabs (Clonidine hcl) .Marland Kitchen... Take 1 tablet by mouth once a day as needed for bp greater than 150 on top number    Valturna 300-320 Mg Tabs (Aliskiren-valsartan) .Marland Kitchen... Take 1 tablet by mouth once a day  Problem # 2:  HYPERLIPIDEMIA-MIXED (ICD-272.4) She is not on any medications for hyperlipidemia. she does take aspirin. She is a nonsmoker.  Patient Instructions: 1)  Your physician has  recommended you make the following change in your medication: For 3 days take 1/2 clonidine and 1/2 valturna.  After three days STOP taking clonidine and take only the valturna 1 tablet daily.   2)  Your physician has requested that you regularly monitor and record your blood pressure readings at home.  Please use the same machine at the same time of day to check your readings and record them to bring to your follow-up visit. IF OVER 150 (TOP NUMBER) Take 1/2 clondine as needed. Please call us with your BP readings in two weeks.  3)  Your physician recommends that you schedule a follow-up appointment in: 1 month

## 2010-07-27 NOTE — Progress Notes (Signed)
  Phone Note Call from Patient   Caller: Patient Summary of Call: Pt called this am and requested samples and rx for Tekturna. Notified pt left samples at front for pick up and [facility] [place]. Initial call taken by: Lanny Hurst RN,  June 08, 2010 10:09 AM    New/Updated Medications: TEKTURNA 300 MG TABS (ALISKIREN FUMARATE) one tablet once daily TEKTURNA 300 MG TABS (ALISKIREN FUMARATE) one tablet once daily Prescriptions: TEKTURNA 300 MG TABS (ALISKIREN FUMARATE) one tablet once daily  #28 x 0   Entered by:   Lanny Hurst RN   Authorized by:   Dossie Arbour MD   Signed by:   Lanny Hurst RN on 06/08/2010   Method used:   Samples Given   RxID:   [id number] TEKTURNA 300 MG TABS (ALISKIREN FUMARATE) one tablet once daily  #30 x 6   Entered by:   Lanny Hurst RN   Authorized by:   Dossie Arbour MD   Signed by:   Lanny Hurst RN on 06/08/2010   Method used:   [facility] to        [facility] [phone]* (retail)       [address]       [place], [place]  [address]       Ph: [phone]       Fax: [phone]   RxID:   [id number]

## 2010-08-03 ENCOUNTER — Ambulatory Visit (INDEPENDENT_AMBULATORY_CARE_PROVIDER_SITE_OTHER): Payer: Medicare Other | Admitting: Cardiovascular Disease

## 2010-08-03 ENCOUNTER — Encounter: Payer: Self-pay | Admitting: Cardiovascular Disease

## 2010-08-03 DIAGNOSIS — I10 Essential (primary) hypertension: Secondary | ICD-10-CM

## 2010-08-10 NOTE — Assessment & Plan Note (Signed)
Summary: f/u appt to discuss medication/sab   Visit Type:  Follow-up Referring Provider:  wall Primary Provider:  Dr. Elease Hashimoto  CC:  Discuss medications.  c/o dizziness with falling to the floor about three weeks ago.  Marland Kitchen  History of Present Illness: [name] returns today for evaluation management of her difficult to control hypertension and intolerance to multiple medications, also with history of rheumatoid arthritis. 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 She has taken herself off numerous other medications including metoprolol, bystolic, lisinopril, HCTZ, Diovan, valturna. we has been maintained her on tekturna 300 mg daily.  She would like to discuss her medication today. She believes that the Danie Chandler is causing worsening flares of her muscle aches and arthritis. She would like to wean herself off this medication and try an alternate medication. We did go through the list of medications she has tried in the past. Several were stopped for "nerves". She is willing to retry one of these medications. she does provide a sheet with her blood pressures with systolics in the 120s to 140 range.   She denies any other significant problems such as shortness of breath or chest pain. she does have trouble walking due to severe foot pain. She is unable to exercise to any degree. She also has significant hand pain secondary to her rheumatoid arthritis.  Typically in our office, her blood pressure is never well controlled though she reports it is because she is always nervous.   Previous echo showing  normal left ventricular systolic function, no segmental wall motion abnormalities, moderate LVH, mild mitral regurgitation, and mild left atrial enlargement.   Current Medications (verified): 1)  Centrum Silver  Tabs (Multiple Vitamins-Minerals) .Marland Kitchen.. 1 Once Daily 2)  Tekturna 300 Mg Tabs (Aliskiren Fumarate) .... One Tablet Once  Daily 3)  Cefuroxime Axetil 250 Mg Tabs (Cefuroxime Axetil) .... Take One Tablet Two Times A Day  Allergies (verified): 1)  ! * Anti Inflamatories 2)  ! * Metoprolol 3)  ! * Amlodipine 4)  ! * Lisinopril 5)  ! * Bystolic 6)  ! Diovan  Past History:  Past Medical History: Last updated: 07/12/09 Anxiety Arthritis Depression Eye Ulcers Hypertension (April 2010) kidney infections  Past Surgical History: Last updated: 07/12/2009 foot surgery  Family History: Last updated: 2009-07-12 Father: deceased 36; MI Mother: deceased 106; MI first one at age 80 Brother: heart problems  Social History: Last updated: 2009-07-12 Retired  Widowed  Tobacco Use - No.  Alcohol Use - no Regular Exercise - no Drug Use - no  Risk Factors: Alcohol Use: 0 (12-Jul-2009) Caffeine Use: yes 2 cups a day (07-12-09) Exercise: no (July 12, 2009)  Risk Factors: Smoking Status: never (2009/07/12)  Review of Systems  The patient denies fever, weight loss, weight gain, vision loss, decreased hearing, hoarseness, chest pain, syncope, dyspnea on exertion, peripheral edema, prolonged cough, abdominal pain, incontinence, muscle weakness, depression, and enlarged lymph nodes.         severe arthritis, pain in her hands and joints, muscle ache  Vital Signs:  Patient profile:   70 year old female Height:      65 inches Weight:      149.75 pounds BMI:     25.01 Pulse rate:   97 / minute BP sitting:   176 / 84  (left arm) Cuff size:   regular  Vitals Entered By: Bishop Dublin, CMA (August 03, 2010 10:40 AM)  Physical Exam  General:  Well developed, well nourished, in no acute distress. disfigurement of the joints of her hands Head:  normocephalic and atraumatic Neck:  Neck supple, no JVD. No masses, thyromegaly or abnormal cervical nodes. Lungs:  Clear bilaterally to auscultation and percussion. Heart:  Non-displaced PMI, chest non-tender; regular rate and rhythm, S1, S2 without murmurs,  rubs or gallops. Carotid upstroke normal, no bruit. Pedals normal pulses. No edema, no varicosities. Abdomen:  Bowel sounds positive; abdomen soft and non-tender without masses Msk:  Back normal, normal gait. Muscle strength and tone normal. 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 Pulses:  pulses normal in all 4 extremities Extremities:  No clubbing or cyanosis. Neurologic:  Alert and oriented x 3. Skin:  Intact without lesions or rashes. Psych:  Normal affect.   Impression & Recommendations:  Problem # 1:  HYPERTENSION, UNSPECIFIED (ICD-401.9) she would like to change her medications again. We have suggested she cut her tekturna in half to 150 mg daily and start Diovan 80 mg daily. If she continues to have muscle ache, she could hold her tekturna and take Diovan 160 mg daily. I've asked her to contact us with her blood pressures in one week. If she is unable to tolerate Diovan, we could retry bystolic or lisinopril.  The following medications were removed from the medication list:    Valturna 300-320 Mg Tabs (Aliskiren-valsartan) .Marland Kitchen... Take 1 tablet by mouth once a day hold for now Her updated medication list for this problem includes:    Tekturna 300 Mg Tabs (Aliskiren fumarate) .Marland Kitchen... Take 1/2 tablet once daily.    Diovan 160 Mg Tabs (Valsartan) .Marland Kitchen... Take 1/2 tablet once daily.  Patient Instructions: 1)  Your physician recommends that you schedule a follow-up appointment in: 6 months 2)  Your physician has recommended you make the following change in your medication: START Diovan 80mg  take 1/2 tablet once daily. DECREASE Tekturna 300mg  to 1/2 tablet once daily. 3)  Your physician has requested that you regularly monitor and record your blood pressure readings at home.  Please use the same machine at the same time of day to check your readings and record them to bring to your follow-up visit. Call our office with BP numbers. Prescriptions: DIOVAN 80 MG TABS (VALSARTAN) Take 1/2 tablet once  daily.  #30 x 6   Entered by:   Lanny Hurst RN   Authorized by:   Dossie Arbour MD   Signed by:   Lanny Hurst RN on 08/03/2010   Method used:   [facility] to        [facility] [phone]* (retail)       [address]       [place], [place]  [address]       Ph: [phone]       Fax: [phone]   RxID:   [id number] TEKTURNA 300 MG TABS (ALISKIREN FUMARATE) Take 1/2 tablet once daily.  #30 x 6   Entered by:   Lanny Hurst RN   Authorized by:   Dossie Arbour MD   Signed by:   Lanny Hurst RN on 08/03/2010   Method used:   [facility] to        [facility] [phone]* (retail)       [address]       [place], [place]  [address]       Ph: [phone]  Fax: [phone]   RxID:   [id number]   Appended Document: f/u appt to discuss medication/sab She cannot take aspirin secondary to history of eye hemorrhage.

## 2010-08-11 ENCOUNTER — Encounter: Payer: Self-pay | Admitting: Cardiovascular Disease

## 2010-08-29 ENCOUNTER — Telehealth: Payer: Self-pay | Admitting: Cardiovascular Disease

## 2010-09-05 NOTE — Progress Notes (Signed)
Summary: BP Problems  Phone Note Call from Patient Call back at Home Phone [phone]   Caller: Self Call For: Gollan Summary of Call: Pt is still feeling dizzy and having headaches.  Pt has had a hemorrage in her eye.  BP seems to be affecting that eye.  Would like to switch medications. Initial call taken by: Harlon Flor,  August 29, 2010 1:59 PM  Follow-up for Phone Call        Pt is calling about this again. Follow-up by: Harlon Flor,  August 30, 2010 8:49 AM  Additional Follow-up for Phone Call Additional follow up Details #1::        Called pt, LMOM TCB. Per last ov note, pt was to send BP number after BP med change, I do not see this scanned in, pt needs to send these before we can change any medications. Additional Follow-up by: Lanny Hurst RN,  August 30, 2010 4:31 PM    Additional Follow-up for Phone Call Additional follow up Details #2::    Spoke to pt this am, she states that she has sent in BP report from after med change by mail, and states the average BP has been running 120s/60s. Per ov note, it states we will have pt decr Tekturna 150mg  once daily, and start diovan 80mg  once daily. And if pt continues to have muscle aches to hold Tekturna and incr Diovan to 160mg  once daily. We had started pt on 1/2 dose of Diovan (40mg  once daily.) and decr Tekturna to 1/2 tab (150mg  once daily) and had instructed pt to send BP results 1 week for follow up after change. Pt states she is now off of the Tekturna and only taking Diovan 80mg  1/2 tablet once daily. Advised pt to continue what she is doing now since BP's are normal and will f/u with her on Mon after Dr. Mariah Milling returns and then we will possibly have her BP records to see if any changes need to be made. Follow-up by: Lanny Hurst RN,  August 31, 2010 8:48 AM

## 2010-09-05 NOTE — Letter (Signed)
Summary: At Home BP Readings  At Home BP Readings   Imported By: Harlon Flor 08/31/2010 08:27:52  _____________________________________________________________________  External Attachment:    Type:   Image     Comment:   External Document  Appended Document: At Home BP Readings BP results after we made BP med changes, pt states she is now off Tekturna and only taking Diovan 80mg  1/2 tablet once daily, phone note to follow. Preliminarily reviewed. Forwarded to MD desktop for review and signature /MES

## 2010-09-08 ENCOUNTER — Telehealth: Payer: Self-pay | Admitting: Cardiovascular Disease

## 2010-09-21 ENCOUNTER — Encounter: Payer: Self-pay | Admitting: Cardiovascular Disease

## 2010-09-21 ENCOUNTER — Ambulatory Visit (INDEPENDENT_AMBULATORY_CARE_PROVIDER_SITE_OTHER): Payer: Medicare Other | Admitting: Cardiovascular Disease

## 2010-09-21 DIAGNOSIS — E785 Hyperlipidemia, unspecified: Secondary | ICD-10-CM

## 2010-09-21 DIAGNOSIS — I10 Essential (primary) hypertension: Secondary | ICD-10-CM

## 2010-09-21 MED ORDER — VALSARTAN 80 MG PO TABS: 40.0000 mg | ORAL_TABLET | Freq: Every day | ORAL | Status: DC

## 2010-09-21 NOTE — Assessment & Plan Note (Signed)
For her hypertension, we will continue the Diovan 40 mg in the evening. She will monitor her blood pressure. She will start Diovan 80 mg in the evening for routine systolic pressures greater than 140.

## 2010-09-21 NOTE — Progress Notes (Signed)
   Patient ID: Melody Garrison, female    DOB: 1940-11-08, 70 y.o.   MRN: [id number]  HPI Melody Garrison returns today for evaluation management of her difficult to control hypertension and intolerance to multiple medications, also with history of rheumatoid arthritis. 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 She has taken herself off numerous other medications including metoprolol, bystolic, lisinopril, HCTZ,  valturna, tekturna 300 mg daily.    Recently she had been tolerating Diovan 40 mg b.i.d. With adequate blood pressure control per her numbers at home though typically she has hypertension in the office. She reports today that the morning dose of Diovan makes her very nervous and jittery. She normally has underlying anxiety   She denies any other significant problems such as shortness of breath or chest pain. she does have trouble walking due to severe foot pain. She is unable to exercise to any degree. She also has significant hand pain secondary to her rheumatoid arthritis.   Typically in our office, her blood pressure is never well controlled though she reports it is because she is always nervous.   Previous echo showing normal left ventricular systolic function, no segmental wall motion abnormalities, moderate LVH, mild mitral regurgitation, and mild left atrial enlargement.    Review of Systems  Unable to perform ROS Constitutional: Negative.   HENT: Negative.   Eyes: Negative.   Respiratory: Negative.   Cardiovascular: Negative.   Gastrointestinal: Negative.   Musculoskeletal: Positive for back pain, joint swelling, arthralgias and gait problem.  Skin: Negative.   Neurological: Negative.   Hematological: Negative.   Psychiatric/Behavioral: Negative.        Anxiety,"nerves"  All other systems reviewed and are negative.    BP 164/76  Pulse 86  Ht 5\' 5"  (1.651 m)  Wt 149 lb (67.586 kg)  BMI 24.79  kg/m2   Physical Exam  Nursing note and vitals reviewed. Constitutional: She is oriented to person, place, and time. She appears well-developed and well-nourished.       Severely disfigured joints on her hands.  HENT:  Head: Normocephalic.  Nose: Nose normal.  Mouth/Throat: Oropharynx is clear and moist.  Eyes: Conjunctivae are normal. Pupils are equal, round, and reactive to light.  Neck: Normal range of motion. Neck supple. No JVD present.  Cardiovascular: Normal rate, regular rhythm, normal heart sounds and intact distal pulses.  Exam reveals no gallop and no friction rub.   No murmur heard. Pulmonary/Chest: Effort normal and breath sounds normal. No respiratory distress. She has no wheezes. She has no rales. She exhibits no tenderness.  Abdominal: Soft. Bowel sounds are normal. She exhibits no distension. There is no tenderness.  Musculoskeletal: Normal range of motion. She exhibits no edema and no tenderness.  Lymphadenopathy:    She has no cervical adenopathy.  Neurological: She is alert and oriented to person, place, and time. Coordination normal.  Skin: Skin is warm and dry. No rash noted. No erythema.  Psychiatric: She has a normal mood and affect. Her behavior is normal. Judgment and thought content normal.      Assessment and Plan

## 2010-09-21 NOTE — Progress Notes (Signed)
Summary: Diovan dosing  Phone Note Call from Patient Call back at Home Phone [phone]   Caller: Patient Call For: Nurse Summary of Call: Pt came into office this morning wanting to know what dose she needs to take of her Diovan. She is currently taking 80mg  1/2 tablet two times a day. pt stated her script was written for 80mg  1/2 tablet once a day. She just wanted to clarify with Dr.Gollan on what dose she needs to be on.  Initial call taken by: Lysbeth Galas CMA,  September 08, 2010 11:15 AM  Follow-up for Phone Call        I had sent you her BP readings and phone note about msg above, please advise. Follow-up by: Lanny Hurst RN,  September 08, 2010 3:57 PM  Additional Follow-up for Phone Call Additional follow up Details #1::        Would stay on dioven 80mg   1/2 dose two times a day      Appended Document: Diovan dosing pt notified/sab   Clinical Lists Changes  Medications: Changed medication from DIOVAN 80 MG TABS (VALSARTAN) Take 1/2 tablet once daily. to DIOVAN 80 MG TABS (VALSARTAN) Take 1/2 tablet two times a day - Signed Rx of DIOVAN 80 MG TABS (VALSARTAN) Take 1/2 tablet two times a day;  #30 x 6;  Signed;  Entered by: Lysbeth Galas CMA;  Authorized by: Dossie Arbour MD;  Method used: [id number] [address], [place], [place], [place]  [address], Ph: [phone], Fax: [phone]    Prescriptions: DIOVAN 80 MG TABS (VALSARTAN) Take 1/2 tablet two times a day  #30 x 6   Entered by:   Lysbeth Galas CMA   Authorized by:   Dossie Arbour MD   Signed by:   Lysbeth Galas CMA on 09/11/2010   Method used:   Electronically to        [facility] [phone]* (retail)       [address]       [place], [place]  [address]       Ph: [phone]       Fax: [phone]   RxID:   [id number]

## 2010-09-21 NOTE — Assessment & Plan Note (Signed)
No medications for her hyperlipidemia at this time. She has been unable to tolerate medications in general.

## 2010-09-21 NOTE — Patient Instructions (Addendum)
Change diovan to 40 mg in the evening. Hold the Am dose.  Monitor blood pressure. If they are elevated, start diovan 80 mg (whole pill) at night.  Your physician recommends that you schedule a follow-up appointment in: 3 months

## 2010-09-26 ENCOUNTER — Ambulatory Visit: Payer: Medicare Other | Admitting: Cardiovascular Disease

## 2010-10-11 ENCOUNTER — Telehealth: Payer: Self-pay | Admitting: *Deleted

## 2010-10-11 NOTE — Telephone Encounter (Signed)
Pt sent letter stating she has been taking Diovan 40mg  once daily and she believes it is causing problems with rheumatoid arthritis flaring up. Pt is requesting this be changed to an "older drug." Please advise.

## 2010-10-11 NOTE — Telephone Encounter (Signed)
Does she remember which one? Tekturna?

## 2010-10-12 NOTE — Telephone Encounter (Signed)
She has tried it... But lets try losartan 50 mg daily, or cut 100 mg in 1/2 to start.

## 2010-10-12 NOTE — Telephone Encounter (Signed)
Pt states she will try the losartan because she does not recall taking that one. I did not see this on in her med history for her previous dose. What dose would you like me to call in for pt?

## 2010-10-12 NOTE — Telephone Encounter (Signed)
Pt clarified that she meant an older medication that has been out for a while, not necessarily one that she has tried before, however, she does state that if you wanted her to try one she has been on before that is ok too.

## 2010-10-12 NOTE — Telephone Encounter (Signed)
Uncertain which one to try as we have tried most. We could retry hydralazine 25 mg in Am and PM and as needed for high BP. We could retry losartan? Retry lisinopril or enalapril?

## 2010-10-13 ENCOUNTER — Telehealth: Payer: Self-pay | Admitting: *Deleted

## 2010-10-13 ENCOUNTER — Other Ambulatory Visit: Payer: Self-pay | Admitting: Cardiovascular Disease

## 2010-10-13 MED ORDER — LOSARTAN POTASSIUM 100 MG PO TABS: 50.0000 mg | ORAL_TABLET | Freq: Every day | ORAL | Status: DC

## 2010-10-13 NOTE — Telephone Encounter (Signed)
Spoke to pt, notified of dose, sent rx in, pt will call back with any side effects or problems with BP.

## 2010-10-19 NOTE — Telephone Encounter (Signed)
Opened in error

## 2010-12-05 ENCOUNTER — Ambulatory Visit (INDEPENDENT_AMBULATORY_CARE_PROVIDER_SITE_OTHER): Payer: Medicare Other | Admitting: Cardiovascular Disease

## 2010-12-05 ENCOUNTER — Encounter: Payer: Self-pay | Admitting: Cardiovascular Disease

## 2010-12-05 DIAGNOSIS — I10 Essential (primary) hypertension: Secondary | ICD-10-CM

## 2010-12-05 DIAGNOSIS — M069 Rheumatoid arthritis, unspecified: Secondary | ICD-10-CM | POA: Insufficient documentation

## 2010-12-05 DIAGNOSIS — E785 Hyperlipidemia, unspecified: Secondary | ICD-10-CM

## 2010-12-05 DIAGNOSIS — F419 Anxiety disorder, unspecified: Secondary | ICD-10-CM | POA: Insufficient documentation

## 2010-12-05 MED ORDER — TRIAMTERENE-HCTZ 37.5-25 MG PO TABS: 1.0000 | ORAL_TABLET | Freq: Every day | ORAL | Status: DC

## 2010-12-05 NOTE — Progress Notes (Signed)
   Patient ID: Melody Garrison, female    DOB: 08-Nov-1940, 70 y.o.   MRN: [id number]  HPI Comments: [name] returns today for evaluation management of her difficult to control hypertension and intolerance to multiple medications, also with history of severe rheumatoid arthritis. 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 She has taken herself off numerous other medications including metoprolol, bystolic, lisinopril, HCTZ,  valturna, tekturna.  She reports that the losartan has been causing swelling of her hands and ankles. She like to try a different medication. She denies any significant shortness of breath. She had been taking 50 mg daily and this had been controlling her blood pressure.  The swelling comes at a time when the temperature is warm her. She is uncertain if the swelling is from the high temperatures though she exhibits from the medicine. She does not report that the medicine caused exacerbation of her underlying anxiety.   she does have trouble walking due to severe foot pain. She is unable to exercise to any degree. She also has significant hand pain secondary to her rheumatoid arthritis.   Typically in our office, her blood pressure is never well controlled though she reports it is because she is always nervous.   Previous echo showing normal left ventricular systolic function, no segmental wall motion abnormalities, moderate LVH, mild mitral regurgitation, and mild left atrial enlargement.  EKG shows normal sinus rhythm with rate 87 beats per minute, significant voltage concerning for LVH, nonspecific ST changes in V6     Review of Systems  Constitutional: Negative.   HENT: Negative.   Eyes: Negative.   Respiratory: Negative.   Cardiovascular: Negative.   Gastrointestinal: Negative.   Musculoskeletal: Positive for joint swelling and arthralgias.  Skin: Negative.   Neurological: Negative.   Hematological:  Negative.   Psychiatric/Behavioral: Negative.   All other systems reviewed and are negative.    BP 165/77  Pulse 88  Ht 5\' 5"  (1.651 m)  Wt 151 lb (68.493 kg)  BMI 25.13 kg/m2  Physical Exam  Nursing note and vitals reviewed. Constitutional: She is oriented to person, place, and time. She appears well-developed and well-nourished.  HENT:  Head: Normocephalic.  Nose: Nose normal.  Mouth/Throat: Oropharynx is clear and moist.  Eyes: Conjunctivae are normal. Pupils are equal, round, and reactive to light.  Neck: Normal range of motion. Neck supple. No JVD present.  Cardiovascular: Normal rate, regular rhythm, S1 normal, S2 normal, normal heart sounds and intact distal pulses.  Exam reveals no gallop and no friction rub.   No murmur heard. Pulmonary/Chest: Effort normal and breath sounds normal. No respiratory distress. She has no wheezes. She has no rales. She exhibits no tenderness.  Abdominal: Soft. Bowel sounds are normal. She exhibits no distension. There is no tenderness.  Musculoskeletal: She exhibits no edema and no tenderness.       Significant finger and joint deformity with mild swelling noted.  Lymphadenopathy:    She has no cervical adenopathy.  Neurological: She is alert and oriented to person, place, and time. Coordination normal.  Skin: Skin is warm and dry. No rash noted. No erythema.  Psychiatric: She has a normal mood and affect. Her behavior is normal. Judgment and thought content normal.         Assessment and Plan

## 2010-12-05 NOTE — Assessment & Plan Note (Signed)
We will try triamterene HCT 37.5/25 mg. We will initiate a half tab for one week increasing to a full pill if her blood pressures are well controlled.

## 2010-12-05 NOTE — Patient Instructions (Signed)
You are doing well. Please start triamterene HCT a 1/2 pill to start. If your blood pressure continue to be elevated, increase to a full pill.  Please call us if you have new issues that need to be addressed before your next appt.  We will call you for a follow up Appt. In 6 months

## 2010-12-05 NOTE — Assessment & Plan Note (Signed)
She does have periods of anxiety. Certain medications she reports makes her anxiety worse.

## 2010-12-05 NOTE — Assessment & Plan Note (Signed)
She has seen Dr. Gavin Potters in consultation. She does not want to start medications for her arthritis at this time.

## 2010-12-18 ENCOUNTER — Ambulatory Visit: Payer: Medicare Other | Admitting: Cardiovascular Disease

## 2010-12-18 ENCOUNTER — Other Ambulatory Visit (INDEPENDENT_AMBULATORY_CARE_PROVIDER_SITE_OTHER): Payer: Medicare Other | Admitting: *Deleted

## 2010-12-18 DIAGNOSIS — Z79899 Other long term (current) drug therapy: Secondary | ICD-10-CM

## 2010-12-18 DIAGNOSIS — I10 Essential (primary) hypertension: Secondary | ICD-10-CM

## 2010-12-19 LAB — BASIC METABOLIC PANEL
BUN: 24 mg/dL — ABNORMAL HIGH (ref 6–23)
CO2: 22 mEq/L (ref 19–32)
Calcium: 9.1 mg/dL (ref 8.4–10.5)
Creat: 1.02 mg/dL (ref 0.50–1.10)

## 2011-03-19 ENCOUNTER — Telehealth: Payer: Self-pay | Admitting: Cardiovascular Disease

## 2011-03-19 NOTE — Telephone Encounter (Signed)
Pt would like to discuss her bp medication with the nurse. Pt want be at home until the morning. If you could call then.

## 2011-03-21 NOTE — Telephone Encounter (Signed)
Triamterene/HCTZ 37.5/25mg  is causing arthritis flare ups to worsen. Pt only takes 1/2 tablet and BP stable at 130s/60s. Seen 11/2010 and we changed to this med and advised she take 1/2 tab for 1 week then incr to 1 tab, she never increased. (Long hx of unable to tolerate certain meds, ARBs). Pt thinks the Ace Inhibitors caused less s/e of her arthritis and would like to try. She also cannot tolerate arthritis med, so she is trying to find BP med that will cause least s/e for this to help control pain. I know last phone note you had suggested retrying Enalapril or Lisinopril. Can we change to this? If so does she still need hctz also? Pt does not want to try hydralazine again, and does not want ARB; prior to triam/hctz she was on losartan that caused probs. Please advise.

## 2011-03-21 NOTE — Telephone Encounter (Signed)
We could try either enalapril or lisinopril enalpril 10 mg BID Or lisinopril 20 mg BID  (hold the pm dose if BP ok)

## 2011-03-22 MED ORDER — ENALAPRIL MALEATE 10 MG PO TABS: 10.0000 mg | ORAL_TABLET | Freq: Two times a day (BID) | ORAL | Status: DC

## 2011-03-22 NOTE — Telephone Encounter (Signed)
Spoke with pt, she will try the enalapril as instructed, and will stop triamterene-hctz new Rx called in.

## 2011-05-29 ENCOUNTER — Encounter: Payer: Self-pay | Admitting: Cardiovascular Disease

## 2011-06-05 ENCOUNTER — Encounter: Payer: Self-pay | Admitting: Cardiovascular Disease

## 2011-06-05 ENCOUNTER — Ambulatory Visit (INDEPENDENT_AMBULATORY_CARE_PROVIDER_SITE_OTHER): Payer: Medicare Other | Admitting: Cardiovascular Disease

## 2011-06-05 VITALS — BP 132/68 | HR 87 | Ht 65.0 in | Wt 152.5 lb

## 2011-06-05 DIAGNOSIS — F419 Anxiety disorder, unspecified: Secondary | ICD-10-CM

## 2011-06-05 DIAGNOSIS — I10 Essential (primary) hypertension: Secondary | ICD-10-CM

## 2011-06-05 DIAGNOSIS — M069 Rheumatoid arthritis, unspecified: Secondary | ICD-10-CM

## 2011-06-05 DIAGNOSIS — F411 Generalized anxiety disorder: Secondary | ICD-10-CM

## 2011-06-05 NOTE — Progress Notes (Signed)
Patient ID: Melody Garrison, female    DOB: Sep 15, 1940, 71 y.o.   MRN: [id number]  HPI Comments: [name] returns today for evaluation management of her difficult to control hypertension and intolerance to multiple medications, also with history of severe rheumatoid arthritis. 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 She has taken herself off numerous other medications including metoprolol, bystolic, lisinopril, HCTZ,  valturna, tekturna.  She reports that she has handled enalapril 10 mg relatively well and her blood pressure typically is in the 130, occasionally 140 range. She still has stress which is probably unrelated to her medications. She feels nervous all the time and reports having tried benzodiazepines and SSRIs. She does not want to try these medications again. The only time she does not feel nervous as when she is sleeping. She would like to wean down on her enalapril a possible. Otherwise she is doing remarkably well and feels well.   she does have trouble walking due to severe foot pain. She is unable to exercise to any degree. She also has significant hand pain secondary to her rheumatoid arthritis.   Previous echo showing normal left ventricular systolic function, no segmental wall motion abnormalities, moderate LVH, mild mitral regurgitation, and mild left atrial enlargement.  EKG shows normal sinus rhythm with rate 87 beats per minute, significant voltage concerning for LVH, nonspecific ST changes in V6   Outpatient Encounter Prescriptions as of 06/05/2011  Medication Sig Dispense Refill  . enalapril (VASOTEC) 10 MG tablet Take 1 tablet (10 mg total) by mouth 2 (two) times daily. Do not take evening dose if BP normal.  30 tablet  11  . Multiple Vitamins-Minerals (CENTRUM SILVER) tablet Take 1 tablet by mouth daily.           Review of Systems  Constitutional: Negative.   HENT: Negative.   Eyes: Negative.     Respiratory: Negative.   Cardiovascular: Negative.   Gastrointestinal: Negative.   Musculoskeletal: Positive for joint swelling and arthralgias.  Skin: Negative.   Neurological: Negative.   Hematological: Negative.   Psychiatric/Behavioral: Negative.   All other systems reviewed and are negative.    BP 132/68  Pulse 87  Ht 5\' 5"  (1.651 m)  Wt 152 lb 8 oz (69.174 kg)  BMI 25.38 kg/m2  Physical Exam  Nursing note and vitals reviewed. Constitutional: She is oriented to person, place, and time. She appears well-developed and well-nourished.  HENT:  Head: Normocephalic.  Nose: Nose normal.  Mouth/Throat: Oropharynx is clear and moist.  Eyes: Conjunctivae are normal. Pupils are equal, round, and reactive to light.  Neck: Normal range of motion. Neck supple. No JVD present.  Cardiovascular: Normal rate, regular rhythm, S1 normal, S2 normal, normal heart sounds and intact distal pulses.  Exam reveals no gallop and no friction rub.   No murmur heard. Pulmonary/Chest: Effort normal and breath sounds normal. No respiratory distress. She has no wheezes. She has no rales. She exhibits no tenderness.  Abdominal: Soft. Bowel sounds are normal. She exhibits no distension. There is no tenderness.  Musculoskeletal: She exhibits no edema and no tenderness.       Significant finger and joint deformity with mild swelling noted.  Lymphadenopathy:    She has no cervical adenopathy.  Neurological: She is alert and oriented to person, place, and time. Coordination normal.  Skin: Skin is warm and dry. No rash noted. No erythema.  Psychiatric: She has a normal  mood and affect. Her behavior is normal. Judgment and thought content normal.         Assessment and Plan

## 2011-06-05 NOTE — Assessment & Plan Note (Signed)
She has tried various prescription medications for anxiety. Uncertain if there is an over-the-counter medication she could try for anxiety. This seems to be her major issue.

## 2011-06-05 NOTE — Patient Instructions (Signed)
You are doing well. It is ok to try enalapril to 5 mg twice a day Take only once a day if BP ok.  Please call us if you have new issues that need to be addressed before your next appt.  The office will contact you for a follow up Appt. In 6 months

## 2011-06-05 NOTE — Assessment & Plan Note (Signed)
She continues to have anxiety which she attributes to the medications. We have suggested it is probably not her medications. She would like to wean down on the enalapril anyway. As her blood pressure is relatively well controlled, we have suggested she try 5 mg of enalapril b.i.d.. If her blood pressure continues to be well controlled, she could try 5 mg daily.

## 2011-06-05 NOTE — Assessment & Plan Note (Signed)
She continues to be affected by severe rheumatoid arthritis. In the past, she has refused treatment. I believe she has been seen by Dr. Oswaldo Milian in the past. She reports that she does not do well on medications.

## 2011-08-24 ENCOUNTER — Telehealth: Payer: Self-pay | Admitting: Cardiovascular Disease

## 2011-08-24 NOTE — Telephone Encounter (Signed)
I left a message for the patient to call. 

## 2011-08-24 NOTE — Telephone Encounter (Signed)
Pt calling about her BP medications. Pt thinks that the medication is making her Arthritis worse. Would like to discuss with nurse.

## 2011-08-24 NOTE — Telephone Encounter (Signed)
Pt was calling to talk with Dr. Mariah Milling about changing her medications b/c of her arthritis and a dry cough she is experiencing. She also wanted to move her appt up to see Dr. Mariah Milling sooner. She said she wanted to wait and call the [place] office on Monday.   Mylo Red RN

## 2011-09-10 ENCOUNTER — Telehealth: Payer: Self-pay | Admitting: Cardiovascular Disease

## 2011-09-10 NOTE — Telephone Encounter (Signed)
N/A.  LMTC. 

## 2011-09-10 NOTE — Telephone Encounter (Signed)
Pt feels the enalapril is making her arthritis worse.  She is also experiencing a dry cough and occasional facial swelling (none currently).  She is requesting a new medication to replace the enalapril.

## 2011-09-10 NOTE — Telephone Encounter (Signed)
Pt would like to speak nurse concerning her medications. She thinks that she may be having a reaction to one of them. She is experiencing face and lip swelling

## 2011-09-10 NOTE — Telephone Encounter (Signed)
We have tried many meds in the past Unsure if we have tried low dose amlodipine? We could try a 5 mg tab, start with a 1/2 dose. She may need the full thing if high.

## 2011-09-10 NOTE — Telephone Encounter (Signed)
Melody Garrison states that Amlodipine caused "bad ankle swelling".

## 2011-09-10 NOTE — Telephone Encounter (Signed)
Dose of enalapril is 5mg  qd.  BP is running 130's/70's per pt.

## 2011-09-11 NOTE — Telephone Encounter (Signed)
Pt was called and told that I have not heard back yet.

## 2011-09-11 NOTE — Telephone Encounter (Signed)
There are a few medications that she has not tried yet one medication is Cardura/doxazosin She could take a two milligram pill and cut this in half Would take it only at nighttime We could titrate the dose upwards as needed  Other medication would be minoxidil

## 2011-09-11 NOTE — Telephone Encounter (Signed)
Follow up from previous call.   Patient calling talk with Hubert Azure on yesterday regarding changed of blood pressure medication.

## 2011-09-12 MED ORDER — DOXAZOSIN MESYLATE 2 MG PO TABS: 1.0000 mg | ORAL_TABLET | Freq: Every day | ORAL | Status: DC

## 2011-09-12 NOTE — Telephone Encounter (Signed)
Mrs Boyers was notified and agrees to try the half tab of doxazosin.  Rx was sent to [facility] in [place] per her request.  She will monitor her bp and let us know how it is doing.

## 2011-10-08 ENCOUNTER — Telehealth: Payer: Self-pay | Admitting: Cardiovascular Disease

## 2011-10-08 NOTE — Telephone Encounter (Signed)
Patient called stating enalapril making her arthritis worse,wants to go back to triam/hctz 37.5/25 mg 1/2 daily.Fowarded to [employer] for his advice.

## 2011-10-08 NOTE — Telephone Encounter (Signed)
Pt has concerns about her BP meds and her rheumatoid arthritis. Please call pt to discuss her concerns

## 2011-10-09 NOTE — Telephone Encounter (Signed)
Melody Garrison states that if Dr Mariah Milling gives her an rx she would like it sent to the [facility] [place].  If she is not home, she states it is fine to leave a message on her voicemail.

## 2011-10-09 NOTE — Telephone Encounter (Signed)
Can we call in a prescription for triamterene HCTZ We could send the full pill and she could cut this in half Number of tabs 30, refill x6

## 2011-10-10 ENCOUNTER — Other Ambulatory Visit: Payer: Self-pay | Admitting: *Deleted

## 2011-10-10 ENCOUNTER — Telehealth: Payer: Self-pay | Admitting: *Deleted

## 2011-10-10 DIAGNOSIS — M069 Rheumatoid arthritis, unspecified: Secondary | ICD-10-CM

## 2011-10-10 DIAGNOSIS — I10 Essential (primary) hypertension: Secondary | ICD-10-CM

## 2011-10-10 MED ORDER — TRIAMTERENE-HCTZ 37.5-25 MG PO TABS: 1.0000 | ORAL_TABLET | Freq: Every day | ORAL | Status: DC

## 2011-10-10 NOTE — Telephone Encounter (Signed)
Called pt and informed her dr Mariah Milling approved her switching to triamterene/HCTZ 37.5/25--i called [facility] in to [facility] notified pt that med was called in--nt

## 2011-12-04 ENCOUNTER — Ambulatory Visit (INDEPENDENT_AMBULATORY_CARE_PROVIDER_SITE_OTHER): Payer: Medicare Other | Admitting: Cardiovascular Disease

## 2011-12-04 ENCOUNTER — Encounter: Payer: Self-pay | Admitting: Cardiovascular Disease

## 2011-12-04 VITALS — BP 170/90 | HR 106 | Ht 64.0 in | Wt 146.0 lb

## 2011-12-04 DIAGNOSIS — F419 Anxiety disorder, unspecified: Secondary | ICD-10-CM

## 2011-12-04 DIAGNOSIS — E785 Hyperlipidemia, unspecified: Secondary | ICD-10-CM

## 2011-12-04 DIAGNOSIS — I10 Essential (primary) hypertension: Secondary | ICD-10-CM

## 2011-12-04 DIAGNOSIS — F411 Generalized anxiety disorder: Secondary | ICD-10-CM

## 2011-12-04 MED ORDER — VERAPAMIL HCL 40 MG PO TABS: 40.0000 mg | ORAL_TABLET | Freq: Three times a day (TID) | ORAL | Status: DC | PRN

## 2011-12-04 NOTE — Assessment & Plan Note (Signed)
She is unable to tolerate most medications. We have almost exhausted all options. We will try verapamil 40 mg 3 times a day. We have suggested she could cut the dose in half to start. Other options include short acting diltiazem, isosorbide or HCTZ.

## 2011-12-04 NOTE — Patient Instructions (Addendum)
You are doing well. Please hold the triamtere HCTZ,  Start verapamil 40 mg up to three times a day for blood pressure  Please call us if you have new issues that need to be addressed before your next appt.  Your physician wants you to follow-up in: 6 months.  You will receive a reminder letter in the mail two months in advance. If you don't receive a letter, please call our office to schedule the follow-up appointment.

## 2011-12-04 NOTE — Progress Notes (Signed)
Patient ID: Melody Garrison, female    DOB: July 05, 1940, 71 y.o.   MRN: [id number]  HPI Comments: Melody Garrison returns today for evaluation management of her difficult to control hypertension and intolerance to multiple medications, also with history of severe rheumatoid arthritis. 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 She has taken herself off numerous other medications including metoprolol, bystolic, lisinopril, HCTZ,  valturna, tekturna.  Almost all blood pressure medications have caused a side effect, either anxiety or other issues. She presents today and reports that triamterene HCTZ has caused diarrhea, rash, throat and tongue swelling. She would like to discontinue this medication. In review of her list of problems with various blood pressure medications, she has not tried verapamil, short acting diltiazem, isosorbide, or HCTZ alone. She does report blood pressure at home was in the 130 range systolic. It is very elevated today as she is anxious.   she does have trouble walking due to severe foot pain. She is unable to exercise to any degree. She also has significant hand pain secondary to her rheumatoid arthritis.   Previous echo showing normal left ventricular systolic function, no segmental wall motion abnormalities, moderate LVH, mild mitral regurgitation, and mild left atrial enlargement.  EKG shows normal sinus rhythm with rate 106 beats per minute, significant voltage concerning for LVH, nonspecific ST changes in V4 to V6   Outpatient Encounter Prescriptions as of 12/04/2011  Medication Sig Dispense Refill  . Multiple Vitamins-Minerals (CENTRUM SILVER) tablet Take 1 tablet by mouth daily.        Marland Kitchen  triamterene-hydrochlorothiazide (MAXZIDE-25) 37.5-25 MG per tablet Take 1 each (1 tablet total) by mouth daily.  30 tablet  6   Review of Systems  Constitutional: Negative.   HENT: Negative.   Eyes: Negative.     Respiratory: Negative.   Cardiovascular: Negative.   Gastrointestinal: Negative.   Musculoskeletal: Positive for joint swelling and arthralgias.  Skin: Negative.   Neurological: Negative.   Hematological: Negative.   Psychiatric/Behavioral: The patient is nervous/anxious.   All other systems reviewed and are negative.    BP 170/90  Pulse 106  Ht 5\' 4"  (1.626 m)  Wt 146 lb (66.225 kg)  BMI 25.06 kg/m2  Physical Exam  Nursing note and vitals reviewed. Constitutional: She is oriented to person, place, and time. She appears well-developed and well-nourished.       Anxious  HENT:  Head: Normocephalic.  Nose: Nose normal.  Mouth/Throat: Oropharynx is clear and moist.  Eyes: Conjunctivae are normal. Pupils are equal, round, and reactive to light.  Neck: Normal range of motion. Neck supple. No JVD present.  Cardiovascular: Normal rate, regular rhythm, S1 normal, S2 normal, normal heart sounds and intact distal pulses.  Exam reveals no gallop and no friction rub.   No murmur heard. Pulmonary/Chest: Effort normal and breath sounds normal. No respiratory distress. She has no wheezes. She has no rales. She exhibits no tenderness.  Abdominal: Soft. Bowel sounds are normal. She exhibits no distension. There is no tenderness.  Musculoskeletal: Normal range of motion. She exhibits no edema and no tenderness.       Significant finger and joint deformity with mild swelling noted.  Lymphadenopathy:    She has no cervical adenopathy.  Neurological: She is alert and oriented to person, place, and time. Coordination normal.  Skin: Skin is warm and dry. No rash noted. No erythema.  Psychiatric: She has a normal mood and  affect. Her behavior is normal. Judgment and thought content normal.         Assessment and Plan

## 2011-12-04 NOTE — Assessment & Plan Note (Signed)
Not recently checked. She is unable to tolerate most medications.

## 2011-12-04 NOTE — Assessment & Plan Note (Signed)
Significant underlying anxiety. Given reactions to medications, she would likely not be able to tolerate any SSRIs or other alternatives.

## 2011-12-10 ENCOUNTER — Other Ambulatory Visit: Payer: Self-pay | Admitting: Cardiovascular Disease

## 2011-12-10 DIAGNOSIS — I10 Essential (primary) hypertension: Secondary | ICD-10-CM

## 2011-12-14 ENCOUNTER — Ambulatory Visit (INDEPENDENT_AMBULATORY_CARE_PROVIDER_SITE_OTHER): Payer: Medicare Other | Admitting: Cardiovascular Disease

## 2011-12-14 ENCOUNTER — Telehealth: Payer: Self-pay | Admitting: Cardiovascular Disease

## 2011-12-14 ENCOUNTER — Encounter: Payer: Self-pay | Admitting: Cardiovascular Disease

## 2011-12-14 VITALS — BP 215/102 | HR 68 | Ht 64.0 in | Wt 149.0 lb

## 2011-12-14 DIAGNOSIS — I10 Essential (primary) hypertension: Secondary | ICD-10-CM

## 2011-12-14 MED ORDER — CARVEDILOL 3.125 MG PO TABS: 3.1250 mg | ORAL_TABLET | Freq: Two times a day (BID) | ORAL | Status: DC

## 2011-12-14 NOTE — Assessment & Plan Note (Signed)
The patient has severe hypertension today. Systolic blood pressure initially was greater than 200. Subsequently his was checked and was 190. It appears that she tolerated Maxide better than verapamil. Thus, verapamil will be discontinued and she will resume Maxide. I will also start her today on carvedilol 3.125 mg twice daily. She is to continue monitoring blood pressure at home. The patient has significant anxiety symptoms contributing to this

## 2011-12-14 NOTE — Progress Notes (Signed)
HPI  Melody Garrison is here today for an urgent evaluation due to severely elevated blood pressure. She saw Dr. Mariah Milling last week. She has a history of difficult to control hypertension and intolerance to multiple medications, also with history of severe rheumatoid arthritis. 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 She has taken herself off numerous other medications including metoprolol, bystolic, lisinopril, HCTZ, valturna, tekturna.  Almost all blood pressure medications have caused a side effect, either anxiety or other issues. During her last visit, she requested a different medication than Maxide due to some vague side effects. She was switched to small dose verapamil 40 mg 3 times daily. However,  she only took half a tablet once daily as it caused her to be dizzy.  Previous echo showing normal left ventricular systolic function, no segmental wall motion abnormalities, moderate LVH, mild mitral regurgitation, and mild left atrial enlargement.     Allergies  Allergen Reactions  . Lisinopril     Nerve problems    . Metoprolol     REACTION: fatique  . Valsartan     Nerve problems     Current Outpatient Prescriptions on File Prior to Visit  Medication Sig Dispense Refill  . Multiple Vitamins-Minerals (CENTRUM SILVER) tablet Take 1 tablet by mouth daily.        . carvedilol (COREG) 3.125 MG tablet Take 1 tablet (3.125 mg total) by mouth 2 (two) times daily.  60 tablet  2     Past Medical History  Diagnosis Date  . Hypertension   . Rheumatoid arthritis 1959  . History of bladder infections   . Anxiety   . Depression   . Kidney infection      Past Surgical History  Procedure Date  . Foot surgery      History reviewed. No pertinent family history.   History   Social History  . Marital Status: Single    Spouse Name: N/A    Number of Children: N/A  . Years of Education: N/A   Occupational History    . Not on file.   Social History Main Topics  . Smoking status: Never Smoker   . Smokeless tobacco: Not on file  . Alcohol Use: No  . Drug Use: No  . Sexually Active: Not on file   Other Topics Concern  . Not on file   Social History Narrative  . No narrative on file     PHYSICAL EXAM   BP 215/102  Pulse 68  Ht 5\' 4"  (1.626 m)  Wt 149 lb (67.586 kg)  BMI 25.58 kg/m2 Constitutional: She is oriented to person, place, and time. She appears well-developed and well-nourished. She is anxious.  HENT: No nasal discharge.  Head: Normocephalic and atraumatic.  Eyes: Pupils are equal and round. Right eye exhibits no discharge. Left eye exhibits no discharge.  Neck: Normal range of motion. Neck supple. No JVD present. No thyromegaly present.  Cardiovascular: Normal rate, regular rhythm, normal heart sounds. Exam reveals no gallop and no friction rub. No murmur heard.  Pulmonary/Chest: Effort normal and breath sounds normal. No stridor. No respiratory distress. She has no wheezes. She has no rales. She exhibits no tenderness.  Abdominal: Soft. Bowel sounds are normal. She exhibits no distension. There is no tenderness. There is no rebound and no guarding.  Neurological: She is alert and oriented to person, place, and time. Coordination normal.     ASSESSMENT AND  PLAN

## 2011-12-14 NOTE — Telephone Encounter (Signed)
Error

## 2011-12-14 NOTE — Patient Instructions (Addendum)
Stop Verapamil.  Start Carvedilol 3.125 mg twice daily.

## 2011-12-21 ENCOUNTER — Telehealth: Payer: Self-pay | Admitting: Cardiovascular Disease

## 2011-12-21 ENCOUNTER — Other Ambulatory Visit: Payer: Self-pay | Admitting: Cardiovascular Disease

## 2011-12-21 NOTE — Telephone Encounter (Signed)
Benicar 40 mg take one tablet daily of samples provided.

## 2011-12-21 NOTE — Telephone Encounter (Signed)
Patient stopped by office with elevated blood pressure.  Dr. Mariah Milling gave patient samples of Benicar 40mg  to try and track blood pressure numbers and call by in a week, if one pill does not work he told her to increase to two pills.

## 2011-12-21 NOTE — Telephone Encounter (Signed)
Error

## 2011-12-28 ENCOUNTER — Other Ambulatory Visit: Payer: Self-pay | Admitting: Cardiovascular Disease

## 2011-12-28 ENCOUNTER — Telehealth: Payer: Self-pay

## 2011-12-28 MED ORDER — OLMESARTAN MEDOXOMIL 40 MG PO TABS: 40.0000 mg | ORAL_TABLET | Freq: Every day | ORAL | Status: DC

## 2011-12-28 NOTE — Telephone Encounter (Signed)
Pt dropped off BP log.... 6/29 183/78, HR=81 BPM 6/30 183/90, HR=85, 179/81, hr=81 7/1   176/83, HR=71, 160/74 HR=74 7/2   161/64, HR=73 7/3   156/81, HR=77 7/4   188/94, HR=77

## 2011-12-28 NOTE — Telephone Encounter (Signed)
pT CAME BY AND DROPPED OFF bp LOG... 6/29 183/78, HR=81 6/30  183/90, HR=85          179/81, HR=81 7/1    176/83, HR=71          160/74, HR=74 7/2     161/74, HR=73 7/3     156/81, HR=77 7/4     188/94, HR=77

## 2011-12-28 NOTE — Telephone Encounter (Signed)
Refilled olmesartan.

## 2011-12-28 NOTE — Telephone Encounter (Signed)
Per your last note 6/28 I advised pt to increase to Benicar 40 mg 2 tablets daily

## 2011-12-28 NOTE — Telephone Encounter (Signed)
I would agree May have to add something to it Maybe HCTZ?

## 2011-12-31 ENCOUNTER — Telehealth: Payer: Self-pay | Admitting: Cardiovascular Disease

## 2011-12-31 NOTE — Telephone Encounter (Signed)
Calling with BP readings from change in meds  168/80 hr 77 151/74 hr 79

## 2011-12-31 NOTE — Telephone Encounter (Signed)
Pt already taking maxide-25.  Do you want to add more HCTZ?

## 2011-12-31 NOTE — Telephone Encounter (Signed)
LMTCB

## 2012-01-01 NOTE — Telephone Encounter (Signed)
Pt would rather wait a few more days before adding more HCTZ.  Looks like BPs are trending down.  She will call us with further BPs

## 2012-01-01 NOTE — Telephone Encounter (Signed)
Calling with BP readings  142/72

## 2012-01-03 ENCOUNTER — Telehealth: Payer: Self-pay | Admitting: Cardiovascular Disease

## 2012-01-03 NOTE — Telephone Encounter (Signed)
Pt came into office stated that the Benicar is going to cost her over 300.00 and wants to know if she can get the new version of Diovan it would only cost 28.00 month. [facility]

## 2012-01-03 NOTE — Telephone Encounter (Signed)
Lets try diovan?

## 2012-01-03 NOTE — Telephone Encounter (Signed)
Can we try losartan instead of benicar?

## 2012-01-04 ENCOUNTER — Other Ambulatory Visit: Payer: Self-pay

## 2012-01-04 MED ORDER — VALSARTAN 40 MG PO TABS: 40.0000 mg | ORAL_TABLET | Freq: Two times a day (BID) | ORAL | Status: DC

## 2012-01-04 MED ORDER — VALSARTAN 40 MG PO TABS: 40.0000 mg | ORAL_TABLET | Freq: Every day | ORAL | Status: DC

## 2012-01-04 NOTE — Telephone Encounter (Signed)
Pt has Diovan listed as an allergy for "nerve problems". I called pt to see what type of problems this caused since we may restart the Diovan.  She says it made her more anxious but she says she is "always that way" and is willing to try this med again. RX sent to pharmacy. She will call us if she cannot tolerate SE

## 2012-01-08 ENCOUNTER — Other Ambulatory Visit: Payer: Self-pay

## 2012-01-08 MED ORDER — VALSARTAN 80 MG PO TABS: 80.0000 mg | ORAL_TABLET | Freq: Every day | ORAL | Status: AC

## 2012-02-11 ENCOUNTER — Telehealth: Payer: Self-pay

## 2012-02-11 NOTE — Telephone Encounter (Signed)
Pt dropped off letter stating: She is taking Diovan 40 mg BID   "     "     "      Carvedilol 3.125 mg BID She no longer takes 1/2 tab triam/HCT x 4 weeks BPs are as follows: 124/71, hr=68 131/69, hr=69 132/77, hr=75 133/77, hr=77 134/60, hr=75 138/73, hr=72 124/76, hr=72 86/72, hr=74 125/69, hr=66 139/66, hr=72 128/74, hr=67 127/74, hr=67 134/78, hr=69 124/81, hr=72 134/80, hr=66 Will forward to Dr. Mariah Milling for his review

## 2012-02-12 ENCOUNTER — Telehealth: Payer: Self-pay | Admitting: Physician Assistant

## 2012-02-12 NOTE — Telephone Encounter (Signed)
NA. LMTCB 

## 2012-02-12 NOTE — Telephone Encounter (Signed)
Numbers looking fantastic

## 2012-02-12 NOTE — Telephone Encounter (Signed)
Pt was feeling dizzy and weak at times. She has been taking her BP and it was as low as 86/62. She stopped the triam/HCT and has been feeling better. She has continued to check her vital signs. The highest one had a systolic BP in the 130s. Most of the time, her SBP has been in the 110s. Heart rates have also been well-controlled.  She wanted to notify the MD of the change in her meds. She has no active issues. I advised her to continue her current medical regimen and keep all follow up appts.

## 2012-02-13 NOTE — Telephone Encounter (Signed)
That is fine, stay off BP med.

## 2012-02-13 NOTE — Telephone Encounter (Signed)
Pt. Informed.  Understanding verb.

## 2012-02-14 NOTE — Telephone Encounter (Signed)
Already addressed 02/13/12

## 2012-02-14 NOTE — Telephone Encounter (Signed)
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 I will forward to [place].

## 2012-03-10 ENCOUNTER — Telehealth: Payer: Self-pay | Admitting: Cardiovascular Disease

## 2012-03-10 ENCOUNTER — Other Ambulatory Visit: Payer: Self-pay

## 2012-03-10 MED ORDER — FAMOTIDINE 10 MG PO TABS: 10.0000 mg | ORAL_TABLET | Freq: Two times a day (BID) | ORAL | Status: DC

## 2012-03-10 NOTE — Telephone Encounter (Signed)
Pt says she had a severe allergic reaction to "something" over w/e. She had unilateral facial edema that alternating left and right, associated with edema of lips. She has been taking benadryl 25 mg PO BID for this.  She held carvedilol last night since she thinks reaction is r/t this med.  But she went ahead and took it this am. She says edema is much better this am but she still feels "puffy" in face. She denies slurred speech or difficulty breathing. She says she is very sensitive to most medications and feel this is r/t most recent medication added-cardvedilol. I explained it would be strange to have such a delayed reaction since she has been on this med for several weeks. She understands but doesn't know what else it may be. I advised her to get appt wiith PCP TODAY and in mean time she should hold carvedilol tonight and I will also discuss with Dr. Mariah Milling.  Understanding verb.

## 2012-03-10 NOTE — Telephone Encounter (Signed)
Very strange presentation. Uncertain if from carvedilol or something else Any food allergies? She could let us know what primary care said. She can continue Benadryl, also could add generic Pepcid or Zantac (histamine blocker)

## 2012-03-10 NOTE — Telephone Encounter (Signed)
See below and advise thanks 

## 2012-03-10 NOTE — Telephone Encounter (Signed)
Pt informed. Understanding verb She was able to speak with PCP office today She cannot be seen until Wednesday 03/12/12 She will keep appt as scheduled She says after speaking with me this am she felt as if her tongue was swelling. She took a benadryl and now feels better I advised her told hold carvedilol tonight and take RX I am sending in with benadryl. She will call us in am to let us know how she is feeling.

## 2012-03-10 NOTE — Telephone Encounter (Signed)
Pt calling states that she had some face swelling and lips one sided. Thinks that its coming from her BP meds. Pt took medication this am anyway.

## 2012-03-11 ENCOUNTER — Telehealth: Payer: Self-pay

## 2012-03-11 NOTE — Telephone Encounter (Signed)
Pt called to say she held diovan last pm instead of coreg as discussed.  She took only 1/2 of her diovan this am and will try holding carvedilol this am. She will take carvedilol tonight with 1/2 diovan. She says she feels well this am and has had no edema. I explained the changes in these meds may cause a rise in BP so she should monitor BP very closely until we find cause of edema. She will keep appt with PCP tomm as scheduled and will call us with report. BP this am=145/81

## 2012-03-11 NOTE — Telephone Encounter (Signed)
fyi

## 2012-03-12 NOTE — Telephone Encounter (Signed)
Pt went to see Dr Elease Hashimoto and wants to discuss what was said.

## 2012-03-13 NOTE — Telephone Encounter (Signed)
FYI

## 2012-03-13 NOTE — Telephone Encounter (Signed)
Pt went to see Dr. Elease Hashimoto yesterday as scheduled. She says Dr. Elease Hashimoto told her facial edema is a "classic side effect of Diovan" and stopped the Diovan and increased carvedilol to 6.25 mg BID.  Her BP this am=127/82, HR=72. She denies any facial edema this am and feels "much better". I advised her to keep monitoring her BP and HR, especially since increasing carvedilol may lower HR. She will call us should she begin to notice low HR (<60 BPM).

## 2012-09-05 ENCOUNTER — Ambulatory Visit: Payer: Self-pay | Admitting: Family Medicine

## 2013-06-12 IMAGING — CR DG CHEST 2V
1 series · 2 of 2 positions shown · non-contrast
Comparison: none

REASON FOR EXAM: shortness of breath
COMMENTS:

[Series 1: pa · 0.17mm/px · 2 of 2 slices shown]
[im 1/2]
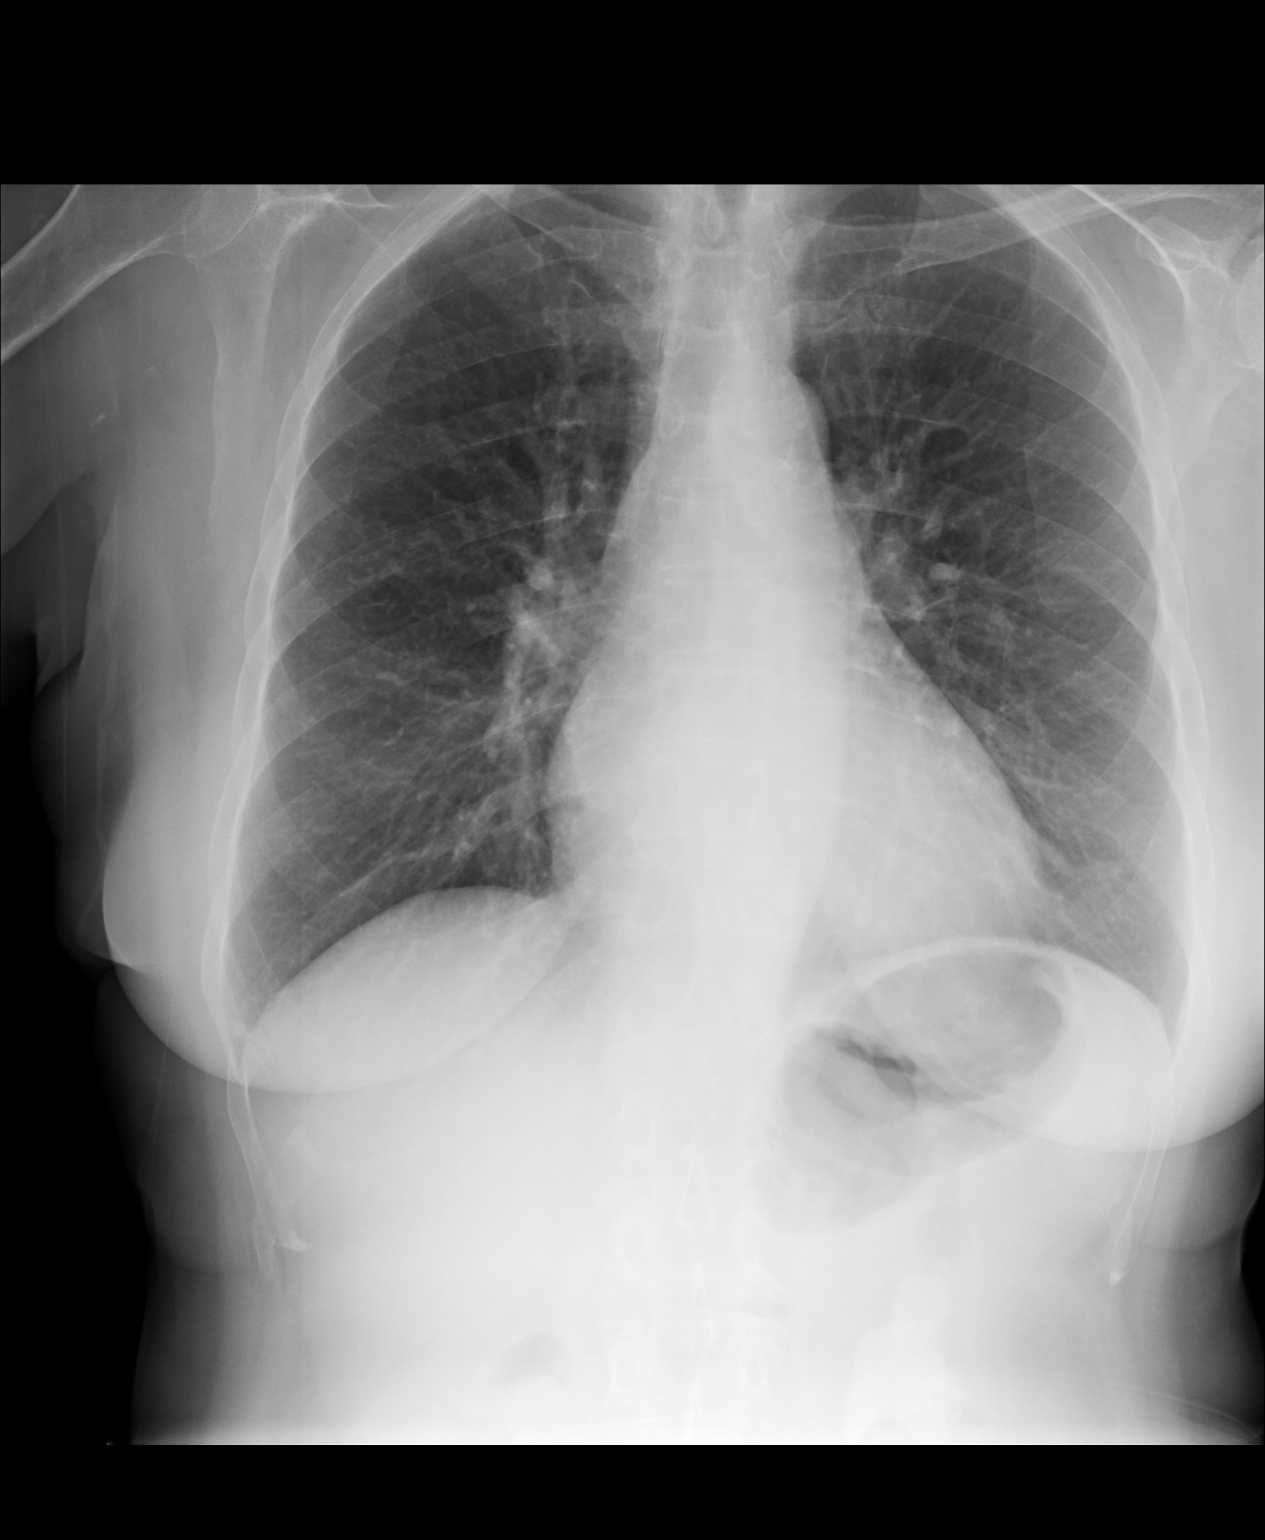
[im 2/2]
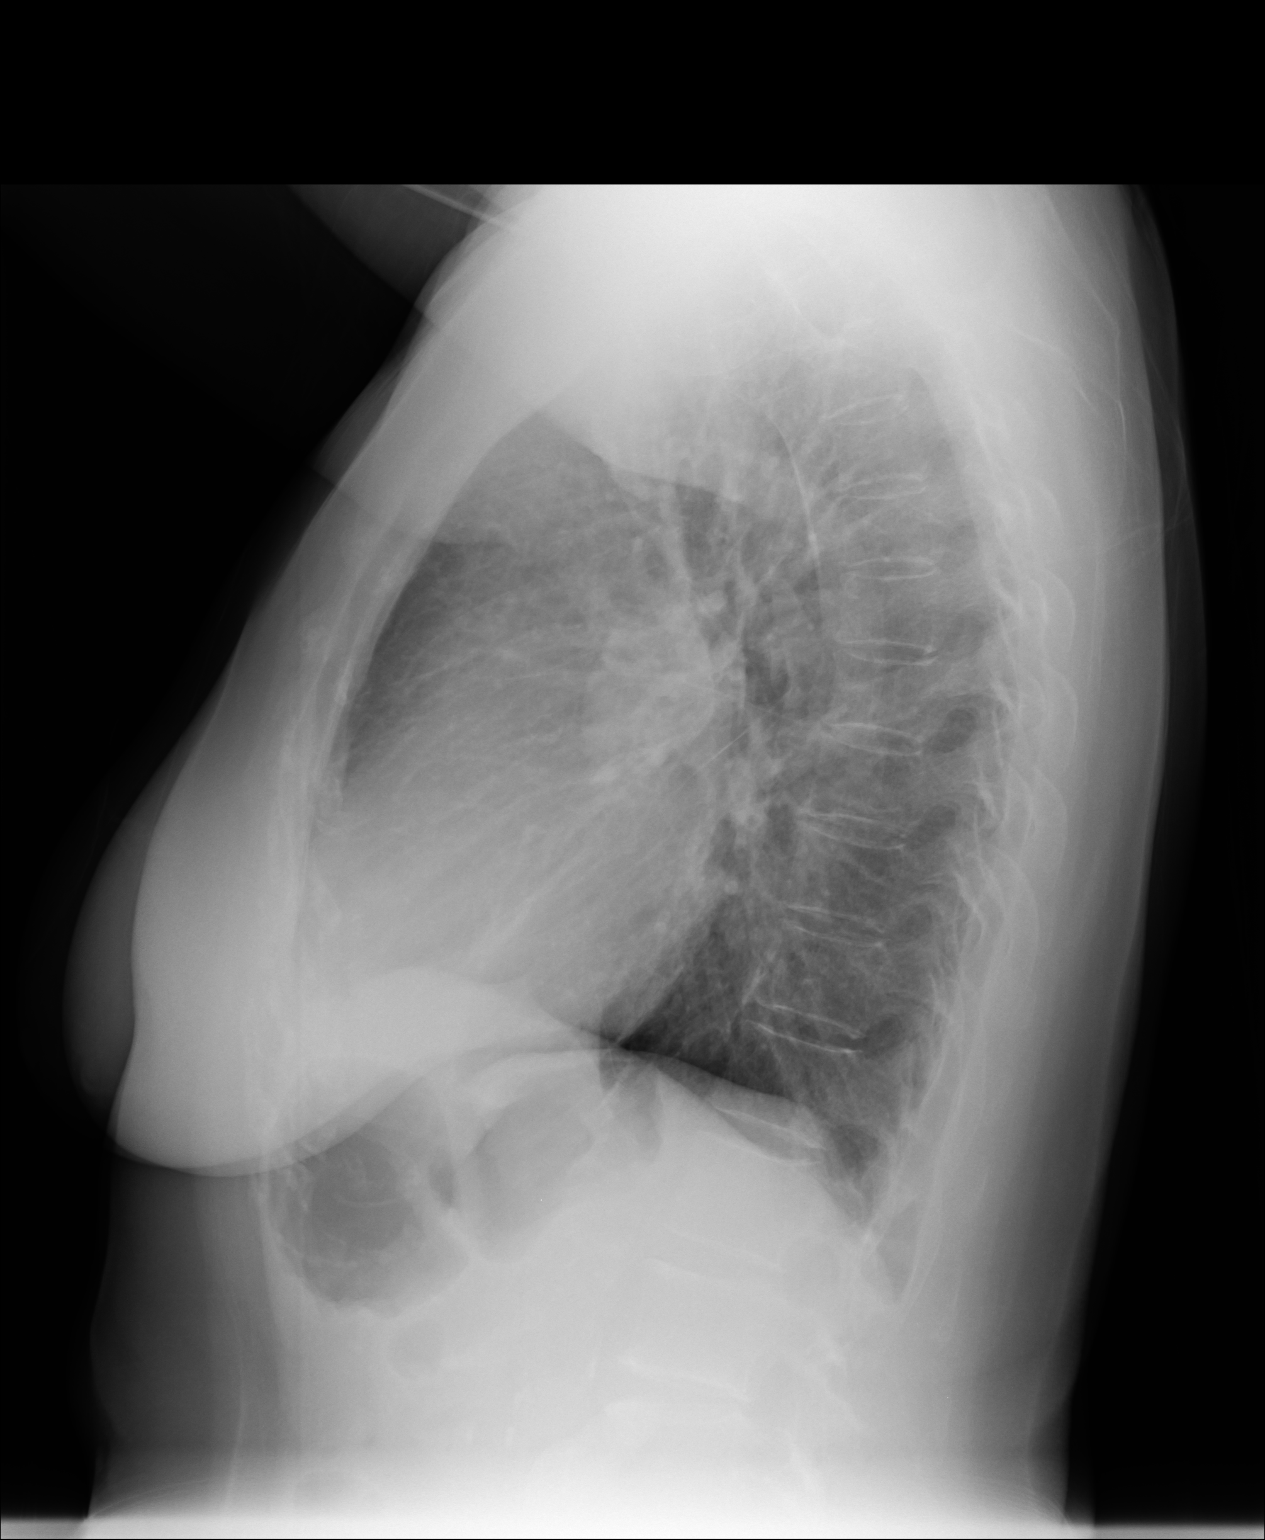

[2 of 2 positions shown; findings below may reference images not displayed]

PROCEDURE:     KDR - KDXR CHEST PA (OR AP) AND LAT  - September 05, 2012 [DATE]

RESULT:     The lungs are mildly hyperinflated. The perihilar interstitial
markings are increased. The cardiac silhouette is normal in size. The
pulmonary vascularity is not engorged. There is tortuosity of the descending
thoracic aorta.
IMPRESSION: There is no evidence of pneumonia nor CHF. I cannot exclude
acute bronchitis with perihilar subsegmental atelectasis. Followup films
following therapy are recommended if the patient's symptoms persi[REDACTED]

## 2013-10-08 ENCOUNTER — Ambulatory Visit: Payer: Self-pay | Admitting: Internal Medicine

## 2013-11-08 ENCOUNTER — Inpatient Hospital Stay: Payer: Self-pay | Admitting: Internal Medicine

## 2013-11-08 LAB — COMPREHENSIVE METABOLIC PANEL
ALK PHOS: 106 U/L
Albumin: 2.8 g/dL — ABNORMAL LOW (ref 3.4–5.0)
Anion Gap: 12 (ref 7–16)
BUN: 52 mg/dL — AB (ref 7–18)
Bilirubin,Total: 1.1 mg/dL — ABNORMAL HIGH (ref 0.2–1.0)
CALCIUM: 8.7 mg/dL (ref 8.5–10.1)
CREATININE: 2.24 mg/dL — AB (ref 0.60–1.30)
Chloride: 94 mmol/L — ABNORMAL LOW (ref 98–107)
Co2: 21 mmol/L (ref 21–32)
EGFR (African American): 24 — ABNORMAL LOW
EGFR (Non-African Amer.): 21 — ABNORMAL LOW
Glucose: 126 mg/dL — ABNORMAL HIGH (ref 65–99)
Osmolality: 271 (ref 275–301)
Potassium: 3.8 mmol/L (ref 3.5–5.1)
SGOT(AST): 285 U/L — ABNORMAL HIGH (ref 15–37)
SGPT (ALT): 247 U/L — ABNORMAL HIGH (ref 12–78)
Sodium: 127 mmol/L — ABNORMAL LOW (ref 136–145)
Total Protein: 6.8 g/dL (ref 6.4–8.2)

## 2013-11-08 LAB — URINALYSIS, COMPLETE
Bilirubin,UR: NEGATIVE
Glucose,UR: NEGATIVE mg/dL (ref 0–75)
Ketone: NEGATIVE
Nitrite: NEGATIVE
PH: 5 (ref 4.5–8.0)
RBC,UR: 6 /HPF (ref 0–5)
Specific Gravity: 1.017 (ref 1.003–1.030)
WBC UR: 177 /HPF (ref 0–5)

## 2013-11-08 LAB — CBC
HCT: 35.5 % (ref 35.0–47.0)
HGB: 11.2 g/dL — AB (ref 12.0–16.0)
MCH: 24.4 pg — ABNORMAL LOW (ref 26.0–34.0)
MCHC: 31.6 g/dL — ABNORMAL LOW (ref 32.0–36.0)
MCV: 77 fL — AB (ref 80–100)
PLATELETS: 288 10*3/uL (ref 150–440)
RBC: 4.61 10*6/uL (ref 3.80–5.20)
RDW: 17.1 % — ABNORMAL HIGH (ref 11.5–14.5)
WBC: 24.8 10*3/uL — ABNORMAL HIGH (ref 3.6–11.0)

## 2013-11-08 LAB — TROPONIN I: TROPONIN-I: 0.13 ng/mL — AB

## 2013-11-08 LAB — PRO B NATRIURETIC PEPTIDE: B-Type Natriuretic Peptide: >175000 pg/mL — ABNORMAL HIGH (ref 0–125)

## 2013-11-09 LAB — BASIC METABOLIC PANEL
ANION GAP: 12 (ref 7–16)
BUN: 53 mg/dL — AB (ref 7–18)
CO2: 20 mmol/L — AB (ref 21–32)
Calcium, Total: 8.5 mg/dL (ref 8.5–10.1)
Chloride: 98 mmol/L (ref 98–107)
Creatinine: 2.11 mg/dL — ABNORMAL HIGH (ref 0.60–1.30)
EGFR (African American): 26 — ABNORMAL LOW
GFR CALC NON AF AMER: 22 — AB
Glucose: 140 mg/dL — ABNORMAL HIGH (ref 65–99)
OSMOLALITY: 278 (ref 275–301)
Potassium: 3.8 mmol/L (ref 3.5–5.1)
Sodium: 130 mmol/L — ABNORMAL LOW (ref 136–145)

## 2013-11-09 LAB — CBC WITH DIFFERENTIAL/PLATELET
BASOS PCT: 0.1 %
Basophil #: 0 10*3/uL (ref 0.0–0.1)
EOS ABS: 0 10*3/uL (ref 0.0–0.7)
EOS PCT: 0 %
HCT: 33.4 % — ABNORMAL LOW (ref 35.0–47.0)
HGB: 10.7 g/dL — ABNORMAL LOW (ref 12.0–16.0)
Lymphocyte #: 0.5 10*3/uL — ABNORMAL LOW (ref 1.0–3.6)
Lymphocyte %: 1.8 %
MCH: 24.9 pg — AB (ref 26.0–34.0)
MCHC: 31.9 g/dL — AB (ref 32.0–36.0)
MCV: 78 fL — ABNORMAL LOW (ref 80–100)
MONOS PCT: 1.1 %
Monocyte #: 0.3 x10 3/mm (ref 0.2–0.9)
NEUTROS ABS: 24.4 10*3/uL — AB (ref 1.4–6.5)
NEUTROS PCT: 97 %
PLATELETS: 233 10*3/uL (ref 150–440)
RBC: 4.29 10*6/uL (ref 3.80–5.20)
RDW: 17.8 % — ABNORMAL HIGH (ref 11.5–14.5)
WBC: 25.1 10*3/uL — ABNORMAL HIGH (ref 3.6–11.0)

## 2013-11-10 LAB — COMPREHENSIVE METABOLIC PANEL
ALK PHOS: 116 U/L
ANION GAP: 12 (ref 7–16)
Albumin: 2.3 g/dL — ABNORMAL LOW (ref 3.4–5.0)
BUN: 65 mg/dL — AB (ref 7–18)
Bilirubin,Total: 0.5 mg/dL (ref 0.2–1.0)
CALCIUM: 8.2 mg/dL — AB (ref 8.5–10.1)
CO2: 19 mmol/L — AB (ref 21–32)
CREATININE: 2.43 mg/dL — AB (ref 0.60–1.30)
Chloride: 100 mmol/L (ref 98–107)
EGFR (African American): 22 — ABNORMAL LOW
EGFR (Non-African Amer.): 19 — ABNORMAL LOW
GLUCOSE: 134 mg/dL — AB (ref 65–99)
OSMOLALITY: 283 (ref 275–301)
Potassium: 3.6 mmol/L (ref 3.5–5.1)
SGOT(AST): 183 U/L — ABNORMAL HIGH (ref 15–37)
SGPT (ALT): 240 U/L — ABNORMAL HIGH (ref 12–78)
Sodium: 131 mmol/L — ABNORMAL LOW (ref 136–145)
TOTAL PROTEIN: 5.8 g/dL — AB (ref 6.4–8.2)

## 2013-11-10 LAB — CBC WITH DIFFERENTIAL/PLATELET
BASOS PCT: 0.1 %
Basophil #: 0 10*3/uL (ref 0.0–0.1)
EOS PCT: 0 %
Eosinophil #: 0 10*3/uL (ref 0.0–0.7)
HCT: 32.8 % — AB (ref 35.0–47.0)
HGB: 10.5 g/dL — AB (ref 12.0–16.0)
LYMPHS ABS: 0.8 10*3/uL — AB (ref 1.0–3.6)
LYMPHS PCT: 3.9 %
MCH: 24.9 pg — AB (ref 26.0–34.0)
MCHC: 32 g/dL (ref 32.0–36.0)
MCV: 78 fL — ABNORMAL LOW (ref 80–100)
Monocyte #: 0.6 x10 3/mm (ref 0.2–0.9)
Monocyte %: 3.3 %
NEUTROS PCT: 92.7 %
Neutrophil #: 18.1 10*3/uL — ABNORMAL HIGH (ref 1.4–6.5)
Platelet: 232 10*3/uL (ref 150–440)
RBC: 4.21 10*6/uL (ref 3.80–5.20)
RDW: 17.5 % — AB (ref 11.5–14.5)
WBC: 19.5 10*3/uL — ABNORMAL HIGH (ref 3.6–11.0)

## 2013-11-10 LAB — LIPID PANEL
Cholesterol: 135 mg/dL (ref 0–200)
HDL Cholesterol: 19 mg/dL — ABNORMAL LOW (ref 40–60)
Ldl Cholesterol, Calc: 101 mg/dL — ABNORMAL HIGH (ref 0–100)
Triglycerides: 75 mg/dL (ref 0–200)
VLDL CHOLESTEROL, CALC: 15 mg/dL (ref 5–40)

## 2013-11-11 LAB — BASIC METABOLIC PANEL
Anion Gap: 12 (ref 7–16)
BUN: 67 mg/dL — ABNORMAL HIGH (ref 7–18)
CALCIUM: 8.2 mg/dL — AB (ref 8.5–10.1)
CO2: 16 mmol/L — AB (ref 21–32)
Chloride: 103 mmol/L (ref 98–107)
Creatinine: 2.37 mg/dL — ABNORMAL HIGH (ref 0.60–1.30)
EGFR (African American): 23 — ABNORMAL LOW
EGFR (Non-African Amer.): 20 — ABNORMAL LOW
Glucose: 118 mg/dL — ABNORMAL HIGH (ref 65–99)
Osmolality: 283 (ref 275–301)
Potassium: 3.9 mmol/L (ref 3.5–5.1)
Sodium: 131 mmol/L — ABNORMAL LOW (ref 136–145)

## 2013-11-11 LAB — PROTEIN / CREATININE RATIO, URINE
CREATININE, URINE: 68.6 mg/dL (ref 30.0–125.0)
PROTEIN, RANDOM URINE: 104 mg/dL — AB (ref 0–12)
Protein/Creat. Ratio: 1516 mg/gCREAT — ABNORMAL HIGH (ref 0–200)

## 2013-11-11 LAB — CBC WITH DIFFERENTIAL/PLATELET
Basophil #: 0 10*3/uL (ref 0.0–0.1)
Basophil %: 0.1 %
Eosinophil #: 0 10*3/uL (ref 0.0–0.7)
Eosinophil %: 0 %
HCT: 36.2 % (ref 35.0–47.0)
HGB: 11.1 g/dL — ABNORMAL LOW (ref 12.0–16.0)
Lymphocyte #: 1.1 10*3/uL (ref 1.0–3.6)
Lymphocyte %: 5.8 %
MCH: 24.1 pg — ABNORMAL LOW (ref 26.0–34.0)
MCHC: 30.8 g/dL — ABNORMAL LOW (ref 32.0–36.0)
MCV: 78 fL — AB (ref 80–100)
MONO ABS: 0.6 x10 3/mm (ref 0.2–0.9)
Monocyte %: 3.2 %
NEUTROS PCT: 90.9 %
Neutrophil #: 17.4 10*3/uL — ABNORMAL HIGH (ref 1.4–6.5)
PLATELETS: 252 10*3/uL (ref 150–440)
RBC: 4.62 10*6/uL (ref 3.80–5.20)
RDW: 17.6 % — ABNORMAL HIGH (ref 11.5–14.5)
WBC: 19.1 10*3/uL — ABNORMAL HIGH (ref 3.6–11.0)

## 2013-11-11 LAB — CLOSTRIDIUM DIFFICILE(ARMC)

## 2013-11-11 LAB — URINE CULTURE

## 2013-11-12 LAB — BASIC METABOLIC PANEL
ANION GAP: 11 (ref 7–16)
BUN: 61 mg/dL — ABNORMAL HIGH (ref 7–18)
Calcium, Total: 8.4 mg/dL — ABNORMAL LOW (ref 8.5–10.1)
Chloride: 100 mmol/L (ref 98–107)
Co2: 19 mmol/L — ABNORMAL LOW (ref 21–32)
Creatinine: 2.2 mg/dL — ABNORMAL HIGH (ref 0.60–1.30)
EGFR (African American): 25 — ABNORMAL LOW
GFR CALC NON AF AMER: 21 — AB
GLUCOSE: 109 mg/dL — AB (ref 65–99)
Osmolality: 279 (ref 275–301)
Potassium: 3.8 mmol/L (ref 3.5–5.1)
SODIUM: 130 mmol/L — AB (ref 136–145)

## 2013-11-13 ENCOUNTER — Encounter: Payer: Self-pay | Admitting: Adult Health

## 2013-11-13 ENCOUNTER — Non-Acute Institutional Stay (SKILLED_NURSING_FACILITY): Payer: Medicare Other | Admitting: Adult Health

## 2013-11-13 DIAGNOSIS — I2589 Other forms of chronic ischemic heart disease: Secondary | ICD-10-CM

## 2013-11-13 DIAGNOSIS — I255 Ischemic cardiomyopathy: Secondary | ICD-10-CM

## 2013-11-13 DIAGNOSIS — E876 Hypokalemia: Secondary | ICD-10-CM

## 2013-11-13 DIAGNOSIS — M069 Rheumatoid arthritis, unspecified: Secondary | ICD-10-CM

## 2013-11-13 DIAGNOSIS — M0560 Rheumatoid arthritis of unspecified site with involvement of other organs and systems: Secondary | ICD-10-CM

## 2013-11-13 DIAGNOSIS — M0520 Rheumatoid vasculitis with rheumatoid arthritis of unspecified site: Secondary | ICD-10-CM

## 2013-11-13 LAB — BASIC METABOLIC PANEL
BUN: 57 mg/dL — AB (ref 4–21)
Creatinine: 1.8 mg/dL — AB (ref 0.5–1.1)
Glucose: 87 mg/dL
POTASSIUM: 3.2 mmol/L — AB (ref 3.4–5.3)
SODIUM: 133 mmol/L — AB (ref 137–147)

## 2013-11-13 LAB — CBC AND DIFFERENTIAL
HEMATOCRIT: 35 % — AB (ref 36–46)
HEMOGLOBIN: 10.9 g/dL — AB (ref 12.0–16.0)
Platelets: 251 10*3/uL (ref 150–399)
WBC: 11.9 10^3/mL

## 2013-11-13 LAB — CULTURE, BLOOD (SINGLE)

## 2013-11-13 LAB — HEPATIC FUNCTION PANEL
ALK PHOS: 95 U/L (ref 25–125)
ALT: 100 U/L — AB (ref 7–35)
AST: 51 U/L — AB (ref 13–35)
BILIRUBIN, TOTAL: 0.7 mg/dL

## 2013-11-13 NOTE — Progress Notes (Signed)
Patient ID: Melody Garrison, female   DOB: [birth year], 73 y.o.   MRN: [id number]     [facility]  Allergies  Allergen Reactions  . Bystolic [Nebivolol Hcl]   . Clonidine Derivatives   . Diltiazem   . Hctz [Hydrochlorothiazide]   . Lisinopril     Nerve problems    . Metoprolol     REACTION: fatique  . Shellfish Allergy   . Tekturna [Aliskiren]   . Valsartan     Nerve problems     Chief Complaint  Patient presents with  . Hospitalization Follow-up    HPI:  This is a complex person who was hospitalized for increased weakness; was found to have a very elevated BNP. 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 She is here for short term rehab. She has on her feet; hands and buttocks small necrotic areas. I have spoken with Dr. Leanord Hawking about her skin lesions; her cardiac issues; her poor tolerance to medications. He is concerned about the lesions and is concerned about the possibility of RA vasculitis.    Past Medical History  Diagnosis Date  . Hypertension   . Rheumatoid arthritis(714.0) 1959  . History of bladder infections   . Anxiety   . Depression   . Kidney infection   . Dysphagia   . CAD (coronary artery disease)   . Ischemic cardiomyopathy     EF 40%     Past Surgical History  Procedure Laterality Date  . Foot surgery      VITAL SIGNS BP 145/90  Pulse 85  Ht 5\' 5"  (1.651 m)  Wt 150 lb (68.04 kg)  BMI 24.96 kg/m2   Patient's Medications  New Prescriptions   No medications on file  Previous Medications   ASPIRIN 325 MG TABLET    Take 325 mg by mouth daily.   ISOSORBIDE DINITRATE (ISORDIL) 30 MG TABLET    Take 30 mg by mouth daily.   SIMVASTATIN (ZOCOR) 10 MG TABLET    Take 5 mg by mouth daily at 6 PM.  Modified Medications   No medications on file  Discontinued Medications   CARVEDILOL (COREG) 3.125 MG TABLET    Take 1 tablet (3.125 mg total) by mouth 2 (two) times daily.   FAMOTIDINE  (PEPCID AC) 10 MG TABLET    Take 1 tablet (10 mg total) by mouth 2 (two) times daily.   MULTIPLE VITAMINS-MINERALS (CENTRUM SILVER) TABLET    Take 1 tablet by mouth daily.     TRIAMTERENE-HYDROCHLOROTHIAZIDE (MAXZIDE-25) 37.5-25 MG PER TABLET    Takes 1.5 tablet prn.    SIGNIFICANT DIAGNOSTIC EXAMS  03-31-1992: chest x-ray: cardiomegaly with mild interstitial edema. Minimal bilateral pleural effusions.   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 No toher evidence of urinary traact stone is identified. Sclerotic lesion in the right superior pubic bone measuring 1.3cm in diameter is identified. No other bony abnormality is identified.    LABS REVIEWED:   11-08-13:wbc24.8; hgb 11.2; hct 35.5; mcv 77; plt 288; BNP >175,000 urine culture: 50,000 colonies: proteus mirabilis  11-10-13: glucose 134; bun 65; creat 1.43; k+3.6; na++131; tbil 0.5; alkphos 116; ast 183; alt 240; albumin 2.3; chol 135; ldl 101; trig 75  11-11-13: wbc 19.1; hgb 11.1; hct 36.2; mcv 78; plt 252  11-12-13: glcuose 109; bun 61; creat 2.20; k+3.8;  na++ 130  11-13-13: wbc 11.9; hgb 10.9; hct  34.8; mcv 77.5; plt 251; glucose 87; bun 57; creat 1.8; k+3.2; na++133      Review of Systems  Constitutional: Negative for malaise/fatigue.  Respiratory: Negative for cough and shortness of breath.   Cardiovascular: Positive for leg swelling. Negative for chest pain and palpitations.  Gastrointestinal: Negative for heartburn, abdominal pain and constipation.  Musculoskeletal: Negative for joint pain and myalgias.  Skin:       Skin in feet is pain has black areas on both feet; hands and buttocks  Psychiatric/Behavioral: Negative for depression. The patient is not nervous/anxious.     Physical Exam  Constitutional: She is oriented to person, place, and time. No distress.  thin  Neck: Neck supple. No JVD present.  Cardiovascular: Normal rate, regular rhythm and  intact distal pulses.   Respiratory: Effort normal and breath sounds normal. No respiratory distress. She has no wheezes.  GI: Soft. Bowel sounds are normal. She exhibits no distension. There is no tenderness.  Musculoskeletal:  Has RA deformities present. Has 3+ edema present generalized from thighs down  Neurological: She is alert and oriented to person, place, and time.  Skin: Skin is warm and dry. She is not diaphoretic.  Skin is frail has bruising on both hands Has necrotic cares on both hands on her knuckles present Has numerous necrotic areas present on her bilateral feet; toes; heels.  Has a red area on the posterior left knee Has small necrotic areas on her buttocks as well These areas are painful and appear to arterial in nature.       ASSESSMENT/ PLAN:  1. 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 wil need to consider beginning her on prednisone in order to her RA will monitor her status   2. Hypokalemia: will being k+ 40 meq daily and will check bmp in one week   3. Ischemic cardiomyopathy: will begin demadex 20 mg daily and will continue her imdur 30 mg daily will monitor her status.   Time spent with patient 50 minutes    Synthia Innocent NP [facility]  Contact [phone] Monday through Friday 8am- 5pm  After hours call [phone]

## 2013-11-14 LAB — STOOL CULTURE

## 2013-11-17 ENCOUNTER — Other Ambulatory Visit: Payer: Self-pay | Admitting: *Deleted

## 2013-11-17 ENCOUNTER — Non-Acute Institutional Stay (SKILLED_NURSING_FACILITY): Payer: Medicare Other | Admitting: Adult Health

## 2013-11-17 DIAGNOSIS — M0560 Rheumatoid arthritis of unspecified site with involvement of other organs and systems: Secondary | ICD-10-CM

## 2013-11-17 DIAGNOSIS — F411 Generalized anxiety disorder: Secondary | ICD-10-CM

## 2013-11-17 DIAGNOSIS — F419 Anxiety disorder, unspecified: Secondary | ICD-10-CM

## 2013-11-17 DIAGNOSIS — M0520 Rheumatoid vasculitis with rheumatoid arthritis of unspecified site: Secondary | ICD-10-CM

## 2013-11-17 DIAGNOSIS — M069 Rheumatoid arthritis, unspecified: Secondary | ICD-10-CM

## 2013-11-17 MED ORDER — ALPRAZOLAM 0.25 MG PO TABS: ORAL_TABLET | ORAL | Status: DC

## 2013-11-17 NOTE — Telephone Encounter (Signed)
[facility] 

## 2013-11-19 DIAGNOSIS — M0520 Rheumatoid vasculitis with rheumatoid arthritis of unspecified site: Secondary | ICD-10-CM | POA: Insufficient documentation

## 2013-11-19 DIAGNOSIS — I255 Ischemic cardiomyopathy: Secondary | ICD-10-CM | POA: Insufficient documentation

## 2013-11-19 DIAGNOSIS — E876 Hypokalemia: Secondary | ICD-10-CM | POA: Insufficient documentation

## 2013-11-19 MED ORDER — POTASSIUM CHLORIDE CRYS ER 20 MEQ PO TBCR: 40.0000 meq | EXTENDED_RELEASE_TABLET | Freq: Every day | ORAL | Status: AC

## 2013-11-19 MED ORDER — PREDNISONE 20 MG PO TABS: 40.0000 mg | ORAL_TABLET | Freq: Every day | ORAL | Status: AC

## 2013-11-19 NOTE — Progress Notes (Signed)
Patient ID: Melody Garrison, female   DOB: 27-Oct-1940, 73 y.o.   MRN: [id number]     [facility]  Allergies  Allergen Reactions  . Bystolic [Nebivolol Hcl]   . Clonidine Derivatives   . Diltiazem   . Hctz [Hydrochlorothiazide]   . Lisinopril     Nerve problems    . Metoprolol     REACTION: fatique  . Shellfish Allergy   . Tekturna [Aliskiren]   . Valsartan     Nerve problems     Chief Complaint  Patient presents with  . Acute Visit    patient concerns     HPI:  We have had a prolonged discussion regarding her RA. She is wiling to take prednisone at this time to see if this medication can help with her disease state. She is very anxious regarding her overall health status. She realizes that since she has declined to treat most of these issues over the years she is now paying the price of these decisions; however; she would like to take something to help with her anxiety.    Past Medical History  Diagnosis Date  . Hypertension   . Rheumatoid arthritis(714.0) 1959  . History of bladder infections   . Anxiety   . Depression   . Kidney infection   . Dysphagia   . CAD (coronary artery disease)   . Ischemic cardiomyopathy     EF 40%    Past Surgical History  Procedure Laterality Date  . Foot surgery      VITAL SIGNS BP 101/79  Pulse 76  Ht 5\' 7"  (1.702 m)  Wt 157 lb 9.6 oz (71.487 kg)  BMI 24.68 kg/m2  SpO2 97%   Patient's Medications  New Prescriptions   No medications on file  Previous Medications   ASPIRIN 325 MG TABLET    Take 325 mg by mouth daily.   ISOSORBIDE DINITRATE (ISORDIL) 30 MG TABLET    Take 30 mg by mouth daily.   POTASSIUM CHLORIDE SA (K-DUR,KLOR-CON) 20 MEQ TABLET    Take 2 tablets (40 mEq total) by mouth daily.   SIMVASTATIN (ZOCOR) 10 MG TABLET    Take 5 mg by mouth daily at 6 PM.  Modified Medications   No medications on file  Discontinued Medications   No medications on file    SIGNIFICANT DIAGNOSTIC EXAMS  11-08-13:wbc24.8;  hgb 11.2; hct 35.5; mcv 77; plt 288; BNP >175,000 urine culture: 50,000 colonies: proteus mirabilis  11-10-13: glucose 134; bun 65; creat 1.43; k+3.6; na++131; tbil 0.5; alkphos 116; ast 183; alt 240; albumin 2.3; chol 135; ldl 101; trig 75  11-11-13: wbc 19.1; hgb 11.1; hct 36.2; mcv 78; plt 252  11-12-13: glcuose 109; bun 61; creat 2.20; k+3.8;  na++ 130  11-13-13: wbc 11.9; hgb 10.9; hct 34.8; mcv 77.5; plt 251; glucose 87; bun 57; creat 1.8; k+3.2; na++133      Review of Systems  Constitutional: Negative for malaise/fatigue.  Respiratory: Negative for cough and shortness of breath.   Cardiovascular: Positive for leg swelling. Negative for chest pain and palpitations.  Gastrointestinal: Negative for heartburn, abdominal pain and constipation.  Musculoskeletal: Negative for joint pain and myalgias.  Skin:       Skin in feet is pain has black areas on both feet; hands and buttocks  Psychiatric/Behavioral: Negative for depression. The patient is not nervous/anxious.     Physical Exam  Constitutional: She is oriented to person, place, and time. No distress.  thin  Neck: Neck  supple. No JVD present.  Cardiovascular: Normal rate, regular rhythm and intact distal pulses.   Respiratory: Effort normal and breath sounds normal. No respiratory distress. She has no wheezes.  GI: Soft. Bowel sounds are normal. She exhibits no distension. There is no tenderness.  Musculoskeletal:  Has RA deformities present. Has 3+ edema present generalized from thighs down  Neurological: She is alert and oriented to person, place, and time.  Skin: Skin is warm and dry. She is not diaphoretic.  Skin is frail has bruising on both hands Has necrotic cares on both hands on her knuckles present Has numerous necrotic areas present on her bilateral feet; toes; heels.  Has a red area on the posterior left knee Has small necrotic areas on her buttocks as well These areas are painful and appear to arterial in nature.        ASSESSMENT/ PLAN:  1. RA: will begin prednisone 20 mg daily and will continue to monitor her status. She is due for derm appointment this week  2. Anxiety is worse; will begin xanax 0.125 mg every 8 hours as needed and will monitor her status.     Synthia Innocent NP [facility]  Contact [phone] Monday through Friday 8am- 5pm  After hours call [phone]

## 2013-11-23 ENCOUNTER — Non-Acute Institutional Stay (SKILLED_NURSING_FACILITY): Payer: Medicare Other | Admitting: Internal Medicine

## 2013-11-23 ENCOUNTER — Encounter: Payer: Self-pay | Admitting: Internal Medicine

## 2013-11-23 DIAGNOSIS — R234 Changes in skin texture: Secondary | ICD-10-CM

## 2013-11-23 DIAGNOSIS — M069 Rheumatoid arthritis, unspecified: Secondary | ICD-10-CM

## 2013-11-23 DIAGNOSIS — F419 Anxiety disorder, unspecified: Secondary | ICD-10-CM

## 2013-11-23 DIAGNOSIS — I2589 Other forms of chronic ischemic heart disease: Secondary | ICD-10-CM

## 2013-11-23 DIAGNOSIS — I119 Hypertensive heart disease without heart failure: Secondary | ICD-10-CM

## 2013-11-23 DIAGNOSIS — F411 Generalized anxiety disorder: Secondary | ICD-10-CM

## 2013-11-23 DIAGNOSIS — I255 Ischemic cardiomyopathy: Secondary | ICD-10-CM

## 2013-11-23 DIAGNOSIS — R239 Unspecified skin changes: Secondary | ICD-10-CM | POA: Insufficient documentation

## 2013-11-23 NOTE — Progress Notes (Signed)
Patient ID: Melody Garrison, female   DOB: 1940-10-30, 73 y.o.   MRN: [id number]     [facility]   PCP: Lorie Phenix, MD  Code Status: full code  Allergies  Allergen Reactions  . Bystolic [Nebivolol Hcl]   . Clonidine Derivatives   . Diltiazem   . Hctz [Hydrochlorothiazide]   . Lisinopril     Nerve problems    . Metoprolol     REACTION: fatique  . Shellfish Allergy   . Tekturna [Aliskiren]   . Valsartan     Nerve problems    Chief Complaint: new admission  HPI:  73 y/o female patient is here for STR after hospital admission from 11/08/13- 11/12/13 with sepsis in setting of UTI with ARF. She was put on antibiotics. She also had staghorn calculus on ultrasound and urology was consulted. Outpatient percutaneous lithotripsy was recommended. Given her deconditioning, rehabilitation was recommended for her. She is seen in her room today. She has hx of RA, OA, ischemic cardiomyopathy, CAD among others. She was recently started on prednisone for her RA and has been seen by dermatology. She had biopsy of her necrotic areas and result is pending. She also saw cardiology today and her imdur, chlorthalidone and lopressor were discontinued. Her blood pressure has been running in 140-150 SBP in the facility as per staff.  She feels weak and gets out of breath easily. She denies any chest pain. She mentions that she has tried isodril and lopressor before and did not tolerate them with loose stool and thus in her office visit today, spoke to the heart doctor and it was decided to have her off these medications.  Review of Systems:  Constitutional: Negative for fever, chills. Has malaise/fatigue   HENT: Negative for congestion, hearing loss and sore throat.   Eyes: Negative for eye pain, blurred vision, double vision and discharge.  Respiratory: Negative for cough, sputum production, wheezing.   Cardiovascular: Negative for chest pain, palpitations. Has leg swelling.  Gastrointestinal:  Negative for heartburn, nausea, vomiting, abdominal pain, constipation. has loose stool Genitourinary: Negative for dysuria, urgency Musculoskeletal: Negative for back pain, falls. Has arthritis changes and pain in joints. Has history of OA and RA Skin: scabs and skin discoloration, followed by dermatology Neurological: Negative for dizziness, tingling, focal weakness and headaches.  Psychiatric/Behavioral: Negative for depression    Past Medical History  Diagnosis Date  . Hypertension   . Rheumatoid arthritis(714.0) 1959  . History of bladder infections   . Anxiety   . Depression   . Kidney infection   . Dysphagia   . CAD (coronary artery disease)   . Ischemic cardiomyopathy     EF 40%   Past Surgical History  Procedure Laterality Date  . Foot surgery     Social History:   reports that she has never smoked. She does not have any smokeless tobacco history on file. She reports that she does not drink alcohol or use illicit drugs.  History reviewed. No pertinent family history.  Medications: Patient's Medications  New Prescriptions   No medications on file  Previous Medications   ALPRAZOLAM (XANAX) 0.25 MG TABLET    Take 1/2 tablet by mouth every 8 hours as needed for anxiety   ASPIRIN 325 MG TABLET    Take 325 mg by mouth daily.   POTASSIUM CHLORIDE SA (K-DUR,KLOR-CON) 20 MEQ TABLET    Take 2 tablets (40 mEq total) by mouth daily.   PREDNISONE (DELTASONE) 20 MG TABLET  Take 2 tablets (40 mg total) by mouth daily with breakfast.   SIMVASTATIN (ZOCOR) 10 MG TABLET    Take 5 mg by mouth daily at 6 PM.   TORSEMIDE (DEMADEX) 20 MG TABLET    Take 20 mg by mouth daily.  Modified Medications   No medications on file  Discontinued Medications   ISOSORBIDE DINITRATE (ISORDIL) 30 MG TABLET    Take 30 mg by mouth daily.     Physical Exam: Filed Vitals:   11/23/13 0941  BP: 146/90  Pulse: 100  Temp: 98 F (36.7 C)  Resp: 18  SpO2: 97%   General- elderly female in no  acute distress Head- atraumatic, normocephalic Eyes- PERRLA, EOMI, no pallor, no icterus, no discharge Neck- no lymphadenopathy, no jugular vein distension Throat- moist mucus membrane Cardiovascular- normal s1,s2, no murmurs Respiratory- bilateral clear to auscultation, no wheeze, no rhonchi, no crackles, no use of accessory muscles Abdomen- bowel sounds present, soft, non tender Musculoskeletal- able to move all 4 extremities, 2+ leg edema Neurological- no focal deficit Skin- warm and dry, has scabs and calluses in both feet, has black discoloration with blisters on her knuckles, feet, heels, toe and behind.  Psychiatry- alert and oriented to person, place and time, appears anxious   Labs reviewed: Basic Metabolic Panel:  Recent Labs  81/19/14  NA 133*  K 3.2*  BUN 57*  CREATININE 1.8*   Liver Function Tests:  Recent Labs  11/13/13  AST 51*  ALT 100*  ALKPHOS 95   CBC:  Recent Labs  11/13/13  WBC 11.9  HGB 10.9*  HCT 35*  PLT 251   11-08-13:wbc24.8; hgb 11.2; hct 35.5; mcv 77; plt 288; BNP >175,000 urine culture: 50,000 colonies: proteus mirabilis   11-10-13: glucose 134; bun 65; creat 1.43; k+3.6; na++131; tbil 0.5; alkphos 116; ast 183; alt 240; albumin 2.3; chol 135; ldl 101; trig 75   11-11-13: wbc 19.1; hgb 11.1; hct 36.2; mcv 78; plt 252   11-12-13: glcuose 109; bun 61; creat 2.20; k+3.8;  na++ 130     Radiological Exams: [id number]: chest x-ray: cardiomegaly with mild interstitial edema. Minimal bilateral pleural effusions.   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 No toher evidence of urinary traact stone is identified. Sclerotic lesion in the right superior pubic bone measuring 1.3cm in diameter is identified. No other bony abnormality is identified.    Assessment/Plan  Generalized weakness In setting of her co-morbidities, progressive RA, ischemic cardiomyopathy and recent  infection. Will have her work with physical therapy and occupational therapy team to help with gait training and muscle strengthening exercises.fall precautions. Skin care. Encourage to be out of bed.   Rheumatoid arthritis With her joint deformities, clearly has progressed disease. She is on prednisone 20 mg daily for now. Will get rheumatology appointment to consider starting DMARDs.   Hypertension bp remains elevated in the facility. With her lopressor, chlorthalidone and imdur being discontinued by cardiology today, it increases the risk for her bp to further rise. Patient is clear about not wanting to take these 3 medications further and understands the complications that can arise. Will have her on hydralazine 25 mg tid for now and reassess further  Skin changes Unclear of the cause at present- possible vasculitis in setting of her RA vs drug eruption. Pending biopsy result from dermatology. Continue steroid and skin care for now  Anxiety Persists. Continue xanax 0.125 mg every 8 hours as needed  and will monitor her status.   Ischemic cardiomyopathy Off b blocker and isordil from today. Continue demadex 20 mg daily and kcl supplement. Monitor bmp   Family/ staff Communication: reviewed care plan with patient and nursing supervisor   Goals of care: short term rehabilitation   Labs/tests ordered: bmp in 1 week    Oneal Grout, MD  [facility] [phone] (Monday-Friday 8 am - 5 pm) [phone] (afterhours)

## 2013-11-25 ENCOUNTER — Other Ambulatory Visit: Payer: Self-pay | Admitting: *Deleted

## 2013-11-25 MED ORDER — TRAMADOL HCL 50 MG PO TABS: ORAL_TABLET | ORAL | Status: DC

## 2013-11-25 NOTE — Telephone Encounter (Signed)
[facility] 

## 2013-11-30 ENCOUNTER — Non-Acute Institutional Stay (SKILLED_NURSING_FACILITY): Payer: Medicare Other | Admitting: Adult Health

## 2013-11-30 DIAGNOSIS — M069 Rheumatoid arthritis, unspecified: Secondary | ICD-10-CM

## 2013-11-30 DIAGNOSIS — N184 Chronic kidney disease, stage 4 (severe): Secondary | ICD-10-CM

## 2013-11-30 DIAGNOSIS — I2589 Other forms of chronic ischemic heart disease: Secondary | ICD-10-CM

## 2013-11-30 DIAGNOSIS — F411 Generalized anxiety disorder: Secondary | ICD-10-CM

## 2013-11-30 DIAGNOSIS — I255 Ischemic cardiomyopathy: Secondary | ICD-10-CM

## 2013-11-30 DIAGNOSIS — F419 Anxiety disorder, unspecified: Secondary | ICD-10-CM

## 2013-12-03 LAB — BASIC METABOLIC PANEL
BUN: 72 mg/dL — AB (ref 4–21)
CREATININE: 1.7 mg/dL — AB (ref 0.5–1.1)
Glucose: 98 mg/dL
POTASSIUM: 4.2 mmol/L (ref 3.4–5.3)
Sodium: 132 mmol/L — AB (ref 137–147)

## 2013-12-08 ENCOUNTER — Non-Acute Institutional Stay (SKILLED_NURSING_FACILITY): Payer: Medicare Other | Admitting: Adult Health

## 2013-12-08 ENCOUNTER — Encounter: Payer: Self-pay | Admitting: Adult Health

## 2013-12-08 DIAGNOSIS — R609 Edema, unspecified: Secondary | ICD-10-CM

## 2013-12-08 DIAGNOSIS — F419 Anxiety disorder, unspecified: Secondary | ICD-10-CM

## 2013-12-08 DIAGNOSIS — I255 Ischemic cardiomyopathy: Secondary | ICD-10-CM

## 2013-12-08 DIAGNOSIS — F411 Generalized anxiety disorder: Secondary | ICD-10-CM

## 2013-12-08 DIAGNOSIS — N200 Calculus of kidney: Secondary | ICD-10-CM

## 2013-12-08 DIAGNOSIS — N184 Chronic kidney disease, stage 4 (severe): Secondary | ICD-10-CM

## 2013-12-08 DIAGNOSIS — I2589 Other forms of chronic ischemic heart disease: Secondary | ICD-10-CM

## 2013-12-08 DIAGNOSIS — M069 Rheumatoid arthritis, unspecified: Secondary | ICD-10-CM

## 2013-12-08 NOTE — Progress Notes (Signed)
Patient ID: Melody Garrison, female   DOB: Aug 01, 1940, 73 y.o.   MRN: [id number]   [facility] (31)  Code Status:  Full code  Allergies  Allergen Reactions  . Bystolic [Nebivolol Hcl]   . Clonidine Derivatives   . Diltiazem   . Hctz [Hydrochlorothiazide]   . Lisinopril     Nerve problems    . Metoprolol     REACTION: fatique  . Shellfish Allergy   . Tekturna [Aliskiren]   . Valsartan     Nerve problems     Chief Complaint  Patient presents with  . Medical Management of Chronic Issues    HPI:  This is a 73 y.o.female staying at [facility] for short term rehab. She has significant deformity from RA. She was recently in the hospital 11/08/13- 11/12/13 with sepsis in setting of UTI with ARF. She was put on antibiotics. She also had staghorn calculus on ultrasound and urology was consulted. Outpatient percutaneous lithotripsy was recommended. Given her deconditioning, rehabilitation was recommended for her. She reports that she is feeling better after beginning Prednisone and ultram for pain. She also reports that she has decreased anxiety and is sleeping better after starting xanax. Her weight has trended downward (16lbs) to 141.4 lbs secondary to diuresis.  She continues to have significant edema to her lower extremities that are now weeping fluid despite an increase of her Demadex to 40mg  on 11/30/13. She denies CP and SOB.   Past Medical History  Diagnosis Date  . Hypertension   . Rheumatoid arthritis(714.0) 1959  . History of bladder infections   . Anxiety   . Depression   . Kidney infection   . Dysphagia   . CAD (coronary artery disease)   . Ischemic cardiomyopathy     EF 40%  . CHF (congestive heart failure)   . Anemia   . Hyperlipidemia   . Myocardial infarction    Past Surgical History  Procedure Laterality Date  . Foot surgery      Outpatient Encounter Prescriptions as of 12/08/2013  Medication Sig  . ALPRAZolam (XANAX) 0.25 MG tablet  Take 1/2 tablet by mouth every 8 hours as needed for anxiety  . aspirin 325 MG tablet Take 325 mg by mouth daily.  . potassium chloride SA (K-DUR,KLOR-CON) 20 MEQ tablet Take 2 tablets (40 mEq total) by mouth daily.  . predniSONE (DELTASONE) 20 MG tablet Take 2 tablets (40 mg total) by mouth daily with breakfast.  . simvastatin (ZOCOR) 10 MG tablet Take 5 mg by mouth daily at 6 PM.  . torsemide (DEMADEX) 20 MG tablet Take 20 mg by mouth daily.  . traMADol (ULTRAM) 50 MG tablet Take 1/2 tablet by mouth every 6 hours as needed for pain       DATA REVIEWED  Radiologic Exams [id number]: chest x-ray: cardiomegaly with mild interstitial edema. Minimal bilateral pleural effusions.  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 No toher evidence of urinary traact stone is identified. Sclerotic lesion in the right superior pubic bone measuring 1.3cm in diameter is identified. No other bony abnormality is identified.    LABS REVIEWED:  11-08-13:wbc24.8; hgb 11.2; hct 35.5; mcv 77; plt 288; BNP >175,000  urine culture: 50,000 colonies: proteus mirabilis  11-10-13: glucose 134; bun 65; creat 1.43; k+3.6; na++131; tbil 0.5; alkphos 116; ast 183; alt 240; albumin 2.3; chol 135; ldl 101; trig 75  11-11-13: wbc 19.1; hgb 11.1;  hct 36.2; mcv 78; plt 252  11-12-13: glcuose 109; bun 61; creat 2.20; k+3.8; na++ 130  11-13-13: wbc 11.9; hgb 10.9; hct 34.8; mcv 77.5; plt 251; glucose 87; bun 57; creat 1.8; k+3.2; na++133     REVIEW OF SYSTEMS  DATA OBTAINED: from patient, nurse, medical record GENERAL: Feels well  No recent fever, fatigue, change in activity status, appetite. Weight decreased due to diuresis RESPIRATORY: No cough, wheezing, SOB CARDIAC: No chest pain, palpitations. EDEMA noted. GI: No abdominal pain  No Nausea,vomiting,diarrhea or constipation  No heartburn or reflux  MUSCULOSKELETAL:chronic pain to feet and hands. Decreased  ROM to hands, wrist, feet NEUROLOGIC: No dizziness, fainting, headache, numbness No change in mental status  PSYCHIATRIC: Improved anxiety. Sleeps well   No behavior issue  PHYSICAL EXAM Filed Vitals:   12/08/13 1636  BP: 110/63  Pulse: 90  Temp: 97 F (36.1 C)  Resp: 20  Weight: 141 lb 6.4 oz (64.139 kg)  SpO2: 95%   Body mass index is 22.14 kg/(m^2). GENERAL APPEARANCE: No acute distress, appropriately groomed, normal body habitus Alert, pleasant, conversant. SKIN: weeping area to anterior lower leg with serous drainage. Minimal erythema. Multiple areas to hands and feet with black dots.  HEAD: Normocephalic, atraumatic. Facial edema noted EYES: Conjunctiva/lids clear  RESPIRATORY: Breathing is even, unlabored  Lung sounds are clear and full  CARDIOVASCULAR: Heart RRR   No murmur or extra heart sounds. Bounding heart beat.   EDEMA:+4 pitting edema, unable to palpate pedal pulses secondary to this ABD: Non distended, non tender BSx4 MUSCULOSKELETAL: Bouchards deformity to hands secondary RA, decreased ROM to the fingers and wrists. Strength 4/5 to BUE and BLE PSYCHIATRIC: Mood and affect appropriate to situation. A and 0x3  ASSESSMENT/PLAN  Cardiomyopathy, ischemic Recently her cardiologist (at the [facility]) discontinued her BB and Isordil secondary intolerance with GI upset. Since then her HR has increased to around 100 with PVCs. We have ordered an EKG and will start Digoxin 0.125 qd and notify her cardiologist.   Edema Continue current dose of Demadex, check CMP on Monday 12/14/13. May need to decreased depending on her kidney function. Continue to elevate her legs and begin TED hose. The staff will need to place kerlix to the weeping areas of her legs and change daily.  Anxiety Improved, continue xanax  Rheumatoid arthritis Continue prednisone ultram. The resident report improvement in pain control. Await Rheumatology input.  Staghorn renal calculus She has  significant renal failure. Apparently lithotripsy was recommended at one time. Will ask the staff to follow up  On this issue. She is not in pain at this time and denies urinary symptoms  Chronic kidney disease (CKD), stage IV (severe) Her BUN has continued to rise and her Cr has decreased slightly over time. Will eventually need renal consult     Labs/tests ordered: Cmp, bnp   Wyatt Portela, NP/[employer], MSN  12/08/2013

## 2013-12-09 ENCOUNTER — Encounter: Payer: Self-pay | Admitting: Adult Health

## 2013-12-09 DIAGNOSIS — R609 Edema, unspecified: Secondary | ICD-10-CM | POA: Insufficient documentation

## 2013-12-09 DIAGNOSIS — N184 Chronic kidney disease, stage 4 (severe): Secondary | ICD-10-CM | POA: Insufficient documentation

## 2013-12-09 DIAGNOSIS — N200 Calculus of kidney: Secondary | ICD-10-CM | POA: Insufficient documentation

## 2013-12-09 NOTE — Assessment & Plan Note (Signed)
Her BUN has continued to rise and her Cr has decreased slightly over time. Will eventually need renal consult

## 2013-12-09 NOTE — Assessment & Plan Note (Signed)
She has significant renal failure. Apparently lithotripsy was recommended at one time. Will ask the staff to follow up  On this issue. She is not in pain at this time and denies urinary symptoms

## 2013-12-09 NOTE — Assessment & Plan Note (Addendum)
Recently her cardiologist (at the [facility]) discontinued her BB and Isordil secondary intolerance with GI upset. Since then her HR has increased to around 100 with PVCs. We have ordered an EKG and will start Digoxin 0.125 qd and notify her cardiologist.

## 2013-12-09 NOTE — Assessment & Plan Note (Addendum)
Continue current dose of Demadex, check CMP on Monday 12/14/13. May need to decreased depending on her kidney function. Continue to elevate her legs and begin TED hose. The staff will need to place kerlix to the weeping areas of her legs and change daily.

## 2013-12-09 NOTE — Assessment & Plan Note (Addendum)
Continue prednisone ultram. The resident report improvement in pain control. Await Rheumatology input.

## 2013-12-09 NOTE — Assessment & Plan Note (Signed)
Improved, continue xanax

## 2013-12-17 MED ORDER — TRAMADOL HCL 50 MG PO TABS: ORAL_TABLET | ORAL | Status: AC

## 2013-12-17 MED ORDER — TORSEMIDE 20 MG PO TABS: 40.0000 mg | ORAL_TABLET | Freq: Every day | ORAL | Status: AC

## 2013-12-17 MED ORDER — ALPRAZOLAM 0.25 MG PO TABS: ORAL_TABLET | ORAL | Status: AC

## 2013-12-17 NOTE — Progress Notes (Signed)
Patient ID: Melody Garrison, female   DOB: 08-27-1940, 73 y.o.   MRN: [id number]     [facility]  Allergies  Allergen Reactions  . Bystolic [Nebivolol Hcl]   . Clonidine Derivatives   . Diltiazem   . Hctz [Hydrochlorothiazide]   . Lisinopril     Nerve problems    . Metoprolol     REACTION: fatique  . Shellfish Allergy   . Tekturna [Aliskiren]   . Valsartan     Nerve problems     Chief Complaint  Patient presents with  . Acute Visit    pain management and edema     HPI:  Her family reports that she is getting relief from her xanax for her anxiety but is not asking for this medication and feels as though her emotional well being will be better served if this medication is made routine. She continues to have edema present although she is on demadex 20 mg daily; she will need this medication increased. She is continuing to have pain. She is not asking for her pain medication on a routine basis and her family feels as though her pain could be better managed if her ultram was made routine as well.    Past Medical History  Diagnosis Date  . Hypertension   . Rheumatoid arthritis(714.0) 1959  . History of bladder infections   . Anxiety   . Depression   . Kidney infection   . Dysphagia   . CAD (coronary artery disease)   . Ischemic cardiomyopathy     EF 40%  . CHF (congestive heart failure)   . Anemia   . Hyperlipidemia   . Myocardial infarction     Past Surgical History  Procedure Laterality Date  . Foot surgery      VITAL SIGNS BP 109/65  Pulse 79  Ht 5\' 5"  (1.651 m)  Wt 142 lb 3.2 oz (64.501 kg)  BMI 23.66 kg/m2  SpO2 96%   Patient's Medications  New Prescriptions   No medications on file  Previous Medications   ALPRAZOLAM (XANAX) 0.25 MG TABLET    Take 1/2 tablet by mouth every 8 hours as needed for anxiety   ASPIRIN 325 MG TABLET    Take 325 mg by mouth daily.   POTASSIUM CHLORIDE SA (K-DUR,KLOR-CON) 20 MEQ TABLET    Take 2 tablets (40 mEq total) by  mouth daily.   PREDNISONE (DELTASONE) 20 MG TABLET    Take 2 tablets (40 mg total) by mouth daily with breakfast.   SIMVASTATIN (ZOCOR) 10 MG TABLET    Take 5 mg by mouth daily at 6 PM.   TORSEMIDE (DEMADEX) 20 MG TABLET    Take 20 mg by mouth daily.   TRAMADOL (ULTRAM) 50 MG TABLET    Take 1/2 tablet by mouth every 6 hours as needed for pain  Modified Medications   No medications on file  Discontinued Medications   No medications on file    SIGNIFICANT DIAGNOSTIC EXAMS  [id number]: chest x-ray: cardiomegaly with mild interstitial edema. Minimal bilateral pleural effusions.   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 No toher evidence of urinary traact stone is identified. Sclerotic lesion in the right superior pubic bone measuring 1.3cm in diameter is identified. No other bony abnormality is identified.   LABS REVIEWED:    11-08-13:wbc24.8; hgb 11.2; hct 35.5; mcv 77; plt 288; BNP >175,000 urine culture: 50,000 colonies: proteus mirabilis  11-10-13:  glucose 134; bun 65; creat 1.43; k+3.6; na++131; tbil 0.5; alkphos 116; ast 183; alt 240; albumin 2.3; chol 135; ldl 101; trig 75  11-11-13: wbc 19.1; hgb 11.1; hct 36.2; mcv 78; plt 252  11-12-13: glcuose 109; bun 61; creat 2.20; k+3.8;  na++ 130  11-13-13: wbc 11.9; hgb 10.9; hct 34.8; mcv 77.5; plt 251; glucose 87; bun 57; creat 1.8; k+3.2; na++133      Review of Systems  Constitutional: Negative for malaise/fatigue.  Respiratory: Negative for cough and shortness of breath.   Cardiovascular: Positive for leg swelling. Negative for chest pain and palpitations.  Gastrointestinal: Negative for heartburn, abdominal pain and constipation.  Musculoskeletal: Negative for joint pain and myalgias.  Skin:       Skin in feet is pain has black areas on both feet; hands and buttocks  Psychiatric/Behavioral: Negative for depression. The patient is not nervous/anxious.      Physical Exam  Constitutional: She is oriented to person, place, and time. No distress.  thin  Neck: Neck supple. No JVD present.  Cardiovascular: Normal rate, regular rhythm and intact distal pulses.   Respiratory: Effort normal and breath sounds normal. No respiratory distress. She has no wheezes.  GI: Soft. Bowel sounds are normal. She exhibits no distension. There is no tenderness.  Musculoskeletal:  Has RA deformities present. Has 3+ edema present generalized from thighs down  Neurological: She is alert and oriented to person, place, and time.  Skin: Skin is warm and dry. She is not diaphoretic.  Skin is frail has bruising on both hands Has necrotic cares on both hands on her knuckles present Has numerous necrotic areas present on her bilateral feet; toes; heels.  Has a red area on the posterior left knee Has small necrotic areas on her buttocks as well      ASSESSMENT/ PLAN:  1. RA: will continue her prednisone 20 mg daily and will change her ultram to 25 mg every 6 hours routinely and will monitor her status   2. Ischemic cardiomyopathy: will increase her demadex to 40 mg daily and will check bmp on 12-04-13.  3. Anxiety: will change her xanax to 0.125 mg every 8 hours routinely and will monitor her status

## 2013-12-21 ENCOUNTER — Non-Acute Institutional Stay (SKILLED_NURSING_FACILITY): Payer: Medicare Other | Admitting: Adult Health

## 2013-12-21 DIAGNOSIS — I255 Ischemic cardiomyopathy: Secondary | ICD-10-CM

## 2013-12-21 DIAGNOSIS — M069 Rheumatoid arthritis, unspecified: Secondary | ICD-10-CM

## 2013-12-21 DIAGNOSIS — N184 Chronic kidney disease, stage 4 (severe): Secondary | ICD-10-CM

## 2013-12-21 DIAGNOSIS — I2589 Other forms of chronic ischemic heart disease: Secondary | ICD-10-CM

## 2013-12-21 DIAGNOSIS — I110 Hypertensive heart disease with heart failure: Secondary | ICD-10-CM

## 2013-12-21 DIAGNOSIS — I509 Heart failure, unspecified: Secondary | ICD-10-CM

## 2013-12-22 ENCOUNTER — Non-Acute Institutional Stay (SKILLED_NURSING_FACILITY): Payer: Medicare Other | Admitting: Adult Health

## 2013-12-22 DIAGNOSIS — L0231 Cutaneous abscess of buttock: Secondary | ICD-10-CM

## 2013-12-22 DIAGNOSIS — L03317 Cellulitis of buttock: Principal | ICD-10-CM

## 2013-12-28 DIAGNOSIS — L03317 Cellulitis of buttock: Principal | ICD-10-CM

## 2013-12-28 DIAGNOSIS — L0231 Cutaneous abscess of buttock: Secondary | ICD-10-CM | POA: Insufficient documentation

## 2013-12-28 NOTE — Progress Notes (Signed)
Patient ID: Melody Garrison, female   DOB: 1941/05/02, 73 y.o.   MRN: [id number]     [facility]  Allergies  Allergen Reactions  . Bystolic [Nebivolol Hcl]   . Clonidine Derivatives   . Diltiazem   . Hctz [Hydrochlorothiazide]   . Lisinopril     Nerve problems    . Metoprolol     REACTION: fatique  . Shellfish Allergy   . Tekturna [Aliskiren]   . Valsartan     Nerve problems     Chief Complaint  Patient presents with  . Acute Visit    wound management     HPI:  The treatment nurse has asked me to come evaluate her sacral wound. The area has developed a foul odor with purulent drainage over the past 24 hours. She is due to be discharged to assisted living in the next 1-2 days. The treatment nurse is concerned about developing cellulitis. There are no reports of fever present.   Past Medical History  Diagnosis Date  . Hypertension   . Rheumatoid arthritis(714.0) 1959  . History of bladder infections   . Anxiety   . Depression   . Kidney infection   . Dysphagia   . CAD (coronary artery disease)   . Ischemic cardiomyopathy     EF 40%  . CHF (congestive heart failure)   . Anemia   . Hyperlipidemia   . Myocardial infarction     Past Surgical History  Procedure Laterality Date  . Foot surgery      VITAL SIGNS BP 110/62  Pulse 68  Ht 5\' 5"  (1.651 m)  Wt 134 lb (60.782 kg)  BMI 22.30 kg/m2   Patient's Medications  New Prescriptions   No medications on file  Previous Medications   ALPRAZOLAM (XANAX) 0.25 MG TABLET    Take 1/2 tablet by mouth every 8 hours   ASPIRIN 325 MG TABLET    Take 325 mg by mouth daily.   POTASSIUM CHLORIDE SA (K-DUR,KLOR-CON) 20 MEQ TABLET    Take 2 tablets (40 mEq total) by mouth daily.   PREDNISONE (DELTASONE) 20 MG TABLET    Take 2 tablets (40 mg total) by mouth daily with breakfast.   SIMVASTATIN (ZOCOR) 10 MG TABLET    Take 5 mg by mouth daily at 6 PM.   TORSEMIDE (DEMADEX) 20 MG TABLET    Take 2 tablets (40 mg total) by  mouth daily.   TRAMADOL (ULTRAM) 50 MG TABLET    Take 1/2 tablet by mouth every 6 hours  Modified Medications   No medications on file  Discontinued Medications   No medications on file    SIGNIFICANT DIAGNOSTIC EXAMS   [id number]: chest x-ray: cardiomegaly with mild interstitial edema. Minimal bilateral pleural effusions.   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 No toher evidence of urinary traact stone is identified. Sclerotic lesion in the right superior pubic bone measuring 1.3cm in diameter is identified. No other bony abnormality is identified.   LABS REVIEWED:    11-08-13:wbc24.8; hgb 11.2; hct 35.5; mcv 77; plt 288; BNP >175,000 urine culture: 50,000 colonies: proteus mirabilis  11-10-13: glucose 134; bun 65; creat 1.43; k+3.6; na++131; tbil 0.5; alkphos 116; ast 183; alt 240; albumin 2.3; chol 135; ldl 101; trig 75  11-11-13: wbc 19.1; hgb 11.1; hct 36.2; mcv 78; plt 252  11-12-13: glcuose 109; bun 61; creat 2.20; k+3.8;  na++ 130  11-13-13: wbc 11.9; hgb  10.9; hct 34.8; mcv 77.5; plt 251; glucose 87; bun 57; creat 1.8; k+3.2; na++133      Review of Systems  Constitutional: Negative for malaise/fatigue.  Respiratory: Negative for cough and shortness of breath.   Cardiovascular: Positive for leg swelling. Negative for chest pain and palpitations.  Gastrointestinal: Negative for heartburn, abdominal pain and constipation.  Musculoskeletal: Negative for joint pain and myalgias.  Skin:       Skin in feethas black areas on both feet; hands; buttocks have opened up.   Psychiatric/Behavioral: Negative for depression. The patient is not nervous/anxious.     Physical Exam  Constitutional: She is oriented to person, place, and time. No distress.  thin  Neck: Neck supple. No JVD present.  Cardiovascular: Normal rate, regular rhythm and intact distal pulses.   Respiratory: Effort normal and breath sounds  normal. No respiratory distress. She has no wheezes.  GI: Soft. Bowel sounds are normal. She exhibits no distension. There is no tenderness.  Musculoskeletal:  Has RA deformities present. Has 2+ edema present generalized from thighs down  Neurological: She is alert and oriented to person, place, and time.  Skin: Skin is warm and dry. She is not diaphoretic.  Skin is frail  Has necrotic cares on both hands on her knuckles present; improving  Has numerous necrotic areas present on her bilateral feet; toes; heels Sacral wound with increased foul odor present and increased drainage present which is purulent.         ASSESSMENT/ PLAN:  1. Sacral wound with cellulitis: will being her on doxycycline 100 mg twice daily for 2 weeks with florastor twice dialy for 2 weeks. I have written her hard scripts for these as well. Will continue her current wound care. She will be followed by home health for further wound care.        Synthia Innocent NP [facility]  Contact [phone] Monday through Friday 8am- 5pm  After hours call [phone]

## 2013-12-28 NOTE — Progress Notes (Signed)
Patient ID: Melody Garrison, female   DOB: 26-Feb-1941, 73 y.o.   MRN: [id number]     [facility]  Allergies  Allergen Reactions  . Bystolic [Nebivolol Hcl]   . Clonidine Derivatives   . Diltiazem   . Hctz [Hydrochlorothiazide]   . Lisinopril     Nerve problems    . Metoprolol     REACTION: fatique  . Shellfish Allergy   . Tekturna [Aliskiren]   . Valsartan     Nerve problems     Chief Complaint  Patient presents with  . Discharge Note    HPI:  She is being discharged to assisted living facility [facility]; as she can no longer live independently.she will need home health for pt/ot/rn wound care. She will need a wheelchair with elevated leg rests in order for her to maintain her current level of independence.     Past Medical History  Diagnosis Date  . Hypertension   . Rheumatoid arthritis(714.0) 1959  . History of bladder infections   . Anxiety   . Depression   . Kidney infection   . Dysphagia   . CAD (coronary artery disease)   . Ischemic cardiomyopathy     EF 40%  . CHF (congestive heart failure)   . Anemia   . Hyperlipidemia   . Myocardial infarction     Past Surgical History  Procedure Laterality Date  . Foot surgery      VITAL SIGNS BP 129/81  Pulse 78  Ht 5\' 5"  (1.651 m)  Wt 134 lb 3.2 oz (60.873 kg)  BMI 22.33 kg/m2   Patient's Medications  New Prescriptions   No medications on file  Previous Medications   ALPRAZOLAM (XANAX) 0.25 MG TABLET    Take 1/2 tablet by mouth every 8 hours   ASPIRIN 325 MG TABLET    Take 325 mg by mouth daily.   POTASSIUM CHLORIDE SA (K-DUR,KLOR-CON) 20 MEQ TABLET    Take 2 tablets (40 mEq total) by mouth daily.   PREDNISONE (DELTASONE) 20 MG TABLET    Take 2 tablets (40 mg total) by mouth daily with breakfast.   SIMVASTATIN (ZOCOR) 10 MG TABLET    Take 5 mg by mouth daily at 6 PM.   TORSEMIDE (DEMADEX) 20 MG TABLET    Take 2 tablets (40 mg total) by mouth daily.   TRAMADOL (ULTRAM) 50 MG TABLET    Take 1/2  tablet by mouth every 6 hours  Modified Medications   No medications on file  Discontinued Medications   No medications on file    SIGNIFICANT DIAGNOSTIC EXAMS  [id number]: chest x-ray: cardiomegaly with mild interstitial edema. Minimal bilateral pleural effusions.   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 No toher evidence of urinary traact stone is identified. Sclerotic lesion in the right superior pubic bone measuring 1.3cm in diameter is identified. No other bony abnormality is identified.   LABS REVIEWED:    11-08-13:wbc24.8; hgb 11.2; hct 35.5; mcv 77; plt 288; BNP >175,000 urine culture: 50,000 colonies: proteus mirabilis  11-10-13: glucose 134; bun 65; creat 1.43; k+3.6; na++131; tbil 0.5; alkphos 116; ast 183; alt 240; albumin 2.3; chol 135; ldl 101; trig 75  11-11-13: wbc 19.1; hgb 11.1; hct 36.2; mcv 78; plt 252  11-12-13: glcuose 109; bun 61; creat 2.20; k+3.8;  na++ 130  11-13-13: wbc 11.9; hgb 10.9; hct 34.8; mcv 77.5; plt 251; glucose 87; bun 57; creat 1.8; k+3.2; na++133  Review of Systems  Constitutional: Negative for malaise/fatigue.  Respiratory: Negative for cough and shortness of breath.   Cardiovascular: Positive for leg swelling. Negative for chest pain and palpitations.  Gastrointestinal: Negative for heartburn, abdominal pain and constipation.  Musculoskeletal: Negative for joint pain and myalgias.  Skin:       Skin in feethas black areas on both feet; hands; buttocks have opened up.   Psychiatric/Behavioral: Negative for depression. The patient is not nervous/anxious.     Physical Exam  Constitutional: She is oriented to person, place, and time. No distress.  thin  Neck: Neck supple. No JVD present.  Cardiovascular: Normal rate, regular rhythm and intact distal pulses.   Respiratory: Effort normal and breath sounds normal. No respiratory distress. She has no wheezes.  GI:  Soft. Bowel sounds are normal. She exhibits no distension. There is no tenderness.  Musculoskeletal:  Has RA deformities present. Has 2+ edema present generalized from thighs down  Neurological: She is alert and oriented to person, place, and time.  Skin: Skin is warm and dry. She is not diaphoretic.  Skin is frail  Has necrotic cares on both hands on her knuckles present; improving  Has numerous necrotic areas present on her bilateral feet; toes; heels.         ASSESSMENT/ PLAN:   Will discharge her to assisted living she will need a wheelchair with elevated leg rests in order to maintain her current level of independence with her adl's. She will need home health for ot/ot/rn for wound care and medication management. Her prescriptions have been written    Time spent with patient 45 minutes     Melody Innocent NP [facility]  Contact [phone] Monday through Friday 8am- 5pm  After hours call [phone]

## 2014-01-29 ENCOUNTER — Ambulatory Visit: Payer: Self-pay

## 2014-05-14 ENCOUNTER — Emergency Department: Payer: Self-pay | Admitting: Emergency Medicine

## 2014-06-11 ENCOUNTER — Encounter: Payer: Self-pay | Admitting: Surgery

## 2014-06-15 LAB — WOUND AEROBIC CULTURE

## 2014-07-02 ENCOUNTER — Inpatient Hospital Stay: Payer: Self-pay | Admitting: Internal Medicine

## 2014-07-02 LAB — BASIC METABOLIC PANEL
Anion Gap: 12 (ref 7–16)
BUN: 61 mg/dL — ABNORMAL HIGH (ref 7–18)
CALCIUM: 8.5 mg/dL (ref 8.5–10.1)
Chloride: 102 mmol/L (ref 98–107)
Co2: 26 mmol/L (ref 21–32)
Creatinine: 2.59 mg/dL — ABNORMAL HIGH (ref 0.60–1.30)
EGFR (Non-African Amer.): 19 — ABNORMAL LOW
GFR CALC AF AMER: 23 — AB
Glucose: 170 mg/dL — ABNORMAL HIGH (ref 65–99)
Osmolality: 301 (ref 275–301)
POTASSIUM: 3.3 mmol/L — AB (ref 3.5–5.1)
SODIUM: 140 mmol/L (ref 136–145)

## 2014-07-02 LAB — CBC WITH DIFFERENTIAL/PLATELET
BASOS PCT: 0.6 %
Basophil #: 0.1 10*3/uL (ref 0.0–0.1)
Eosinophil #: 0.1 10*3/uL (ref 0.0–0.7)
Eosinophil %: 0.4 %
HCT: 40.8 % (ref 35.0–47.0)
HGB: 12.4 g/dL (ref 12.0–16.0)
LYMPHS ABS: 1.1 10*3/uL (ref 1.0–3.6)
Lymphocyte %: 6.9 %
MCH: 25.1 pg — AB (ref 26.0–34.0)
MCHC: 30.3 g/dL — ABNORMAL LOW (ref 32.0–36.0)
MCV: 83 fL (ref 80–100)
MONO ABS: 0.4 x10 3/mm (ref 0.2–0.9)
Monocyte %: 2.4 %
NEUTROS PCT: 89.7 %
Neutrophil #: 13.6 10*3/uL — ABNORMAL HIGH (ref 1.4–6.5)
Platelet: 141 10*3/uL — ABNORMAL LOW (ref 150–440)
RBC: 4.93 10*6/uL (ref 3.80–5.20)
RDW: 23.5 % — ABNORMAL HIGH (ref 11.5–14.5)
WBC: 15.2 10*3/uL — AB (ref 3.6–11.0)

## 2014-07-02 LAB — TROPONIN I: Troponin-I: 0.07 ng/mL — ABNORMAL HIGH

## 2014-07-03 LAB — HEPATIC FUNCTION PANEL A (ARMC)
ALK PHOS: 155 U/L — AB
Albumin: 2.5 g/dL — ABNORMAL LOW (ref 3.4–5.0)
Bilirubin, Direct: 0.5 mg/dL — ABNORMAL HIGH (ref 0.0–0.2)
Bilirubin,Total: 1.1 mg/dL — ABNORMAL HIGH (ref 0.2–1.0)
SGOT(AST): 35 U/L (ref 15–37)
SGPT (ALT): 40 U/L
TOTAL PROTEIN: 5.9 g/dL — AB (ref 6.4–8.2)

## 2014-07-03 LAB — CBC WITH DIFFERENTIAL/PLATELET
Basophil #: 0.1 10*3/uL (ref 0.0–0.1)
Basophil %: 0.9 %
Eosinophil #: 0.1 10*3/uL (ref 0.0–0.7)
Eosinophil %: 1 %
HCT: 39.6 % (ref 35.0–47.0)
HGB: 12 g/dL (ref 12.0–16.0)
LYMPHS ABS: 1 10*3/uL (ref 1.0–3.6)
Lymphocyte %: 6.8 %
MCH: 25.3 pg — ABNORMAL LOW (ref 26.0–34.0)
MCHC: 30.4 g/dL — AB (ref 32.0–36.0)
MCV: 83 fL (ref 80–100)
MONOS PCT: 3.5 %
Monocyte #: 0.5 x10 3/mm (ref 0.2–0.9)
Neutrophil #: 12.9 10*3/uL — ABNORMAL HIGH (ref 1.4–6.5)
Neutrophil %: 87.8 %
Platelet: 120 10*3/uL — ABNORMAL LOW (ref 150–440)
RBC: 4.77 10*6/uL (ref 3.80–5.20)
RDW: 23.8 % — ABNORMAL HIGH (ref 11.5–14.5)
WBC: 14.6 10*3/uL — ABNORMAL HIGH (ref 3.6–11.0)

## 2014-07-03 LAB — BASIC METABOLIC PANEL
Anion Gap: 10 (ref 7–16)
BUN: 61 mg/dL — ABNORMAL HIGH (ref 7–18)
CHLORIDE: 105 mmol/L (ref 98–107)
CO2: 24 mmol/L (ref 21–32)
Calcium, Total: 8.3 mg/dL — ABNORMAL LOW (ref 8.5–10.1)
Creatinine: 2.32 mg/dL — ABNORMAL HIGH (ref 0.60–1.30)
EGFR (African American): 26 — ABNORMAL LOW
EGFR (Non-African Amer.): 22 — ABNORMAL LOW
GLUCOSE: 79 mg/dL (ref 65–99)
OSMOLALITY: 294 (ref 275–301)
Potassium: 3.2 mmol/L — ABNORMAL LOW (ref 3.5–5.1)
Sodium: 139 mmol/L (ref 136–145)

## 2014-07-03 LAB — TROPONIN I
TROPONIN-I: 0.06 ng/mL — AB
Troponin-I: 0.06 ng/mL — ABNORMAL HIGH
Troponin-I: 0.06 ng/mL — ABNORMAL HIGH

## 2014-07-04 LAB — COMPREHENSIVE METABOLIC PANEL
ALT: 32 U/L
Albumin: 2.6 g/dL — ABNORMAL LOW (ref 3.4–5.0)
Alkaline Phosphatase: 142 U/L — ABNORMAL HIGH
Anion Gap: 12 (ref 7–16)
BUN: 59 mg/dL — AB (ref 7–18)
Bilirubin,Total: 1.1 mg/dL — ABNORMAL HIGH (ref 0.2–1.0)
CALCIUM: 8.3 mg/dL — AB (ref 8.5–10.1)
CHLORIDE: 101 mmol/L (ref 98–107)
CO2: 25 mmol/L (ref 21–32)
Creatinine: 2.38 mg/dL — ABNORMAL HIGH (ref 0.60–1.30)
GFR CALC AF AMER: 26 — AB
GFR CALC NON AF AMER: 21 — AB
Glucose: 108 mg/dL — ABNORMAL HIGH (ref 65–99)
Osmolality: 293 (ref 275–301)
Potassium: 3.5 mmol/L (ref 3.5–5.1)
SGOT(AST): 27 U/L (ref 15–37)
Sodium: 138 mmol/L (ref 136–145)
Total Protein: 6.2 g/dL — ABNORMAL LOW (ref 6.4–8.2)

## 2014-07-04 LAB — HEMOGLOBIN: HGB: 12.1 g/dL (ref 12.0–16.0)

## 2014-07-04 LAB — PLATELET COUNT: PLATELETS: 120 10*3/uL — AB (ref 150–440)

## 2014-07-06 LAB — COMPREHENSIVE METABOLIC PANEL
ALBUMIN: 2.4 g/dL — AB (ref 3.4–5.0)
ALK PHOS: 143 U/L — AB
ANION GAP: 9 (ref 7–16)
BUN: 62 mg/dL — AB (ref 7–18)
Bilirubin,Total: 0.9 mg/dL (ref 0.2–1.0)
CALCIUM: 7.9 mg/dL — AB (ref 8.5–10.1)
Chloride: 101 mmol/L (ref 98–107)
Co2: 26 mmol/L (ref 21–32)
Creatinine: 2.21 mg/dL — ABNORMAL HIGH (ref 0.60–1.30)
EGFR (African American): 28 — ABNORMAL LOW
EGFR (Non-African Amer.): 23 — ABNORMAL LOW
GLUCOSE: 122 mg/dL — AB (ref 65–99)
Osmolality: 291 (ref 275–301)
POTASSIUM: 3.3 mmol/L — AB (ref 3.5–5.1)
SGOT(AST): 31 U/L (ref 15–37)
SGPT (ALT): 19 U/L
Sodium: 136 mmol/L (ref 136–145)
TOTAL PROTEIN: 5.7 g/dL — AB (ref 6.4–8.2)

## 2014-07-06 LAB — CBC WITH DIFFERENTIAL/PLATELET
BASOS PCT: 1.3 %
Basophil #: 0.1 10*3/uL (ref 0.0–0.1)
EOS PCT: 4.5 %
Eosinophil #: 0.3 10*3/uL (ref 0.0–0.7)
HCT: 38.9 % (ref 35.0–47.0)
HGB: 12.1 g/dL (ref 12.0–16.0)
Lymphocyte #: 0.7 10*3/uL — ABNORMAL LOW (ref 1.0–3.6)
Lymphocyte %: 11 %
MCH: 25.9 pg — AB (ref 26.0–34.0)
MCHC: 31.2 g/dL — AB (ref 32.0–36.0)
MCV: 83 fL (ref 80–100)
Monocyte #: 0.6 x10 3/mm (ref 0.2–0.9)
Monocyte %: 8.6 %
Neutrophil #: 5 10*3/uL (ref 1.4–6.5)
Neutrophil %: 74.6 %
Platelet: 131 10*3/uL — ABNORMAL LOW (ref 150–440)
RBC: 4.68 10*6/uL (ref 3.80–5.20)
RDW: 23.6 % — AB (ref 11.5–14.5)
WBC: 6.7 10*3/uL (ref 3.6–11.0)

## 2014-07-06 LAB — MAGNESIUM: MAGNESIUM: 2.2 mg/dL

## 2014-07-07 LAB — CULTURE, BLOOD (SINGLE)

## 2014-07-10 LAB — WOUND AEROBIC CULTURE

## 2014-07-12 ENCOUNTER — Encounter (HOSPITAL_BASED_OUTPATIENT_CLINIC_OR_DEPARTMENT_OTHER): Payer: PRIVATE HEALTH INSURANCE | Attending: Plastic Surgery

## 2014-08-27 ENCOUNTER — Encounter (HOSPITAL_BASED_OUTPATIENT_CLINIC_OR_DEPARTMENT_OTHER): Payer: PRIVATE HEALTH INSURANCE

## 2014-09-20 ENCOUNTER — Emergency Department: Payer: Self-pay | Admitting: Emergency Medicine

## 2014-09-20 ENCOUNTER — Encounter (HOSPITAL_COMMUNITY): Payer: Self-pay | Admitting: Emergency Medicine

## 2014-09-20 ENCOUNTER — Emergency Department (HOSPITAL_COMMUNITY)
Admission: EM | Admit: 2014-09-20 | Discharge: 2014-09-24 | Disposition: E | Payer: Medicare Other | Source: Other Acute Inpatient Hospital | Attending: Emergency Medicine | Admitting: Emergency Medicine

## 2014-09-20 ENCOUNTER — Encounter (HOSPITAL_COMMUNITY)
Admission: EM | Disposition: E | Payer: Self-pay | Source: Other Acute Inpatient Hospital | Attending: Emergency Medicine

## 2014-09-20 DIAGNOSIS — E785 Hyperlipidemia, unspecified: Secondary | ICD-10-CM | POA: Insufficient documentation

## 2014-09-20 DIAGNOSIS — Z862 Personal history of diseases of the blood and blood-forming organs and certain disorders involving the immune mechanism: Secondary | ICD-10-CM | POA: Insufficient documentation

## 2014-09-20 DIAGNOSIS — I469 Cardiac arrest, cause unspecified: Secondary | ICD-10-CM | POA: Insufficient documentation

## 2014-09-20 DIAGNOSIS — F419 Anxiety disorder, unspecified: Secondary | ICD-10-CM | POA: Diagnosis not present

## 2014-09-20 DIAGNOSIS — Z79899 Other long term (current) drug therapy: Secondary | ICD-10-CM | POA: Diagnosis not present

## 2014-09-20 DIAGNOSIS — I229 Subsequent ST elevation (STEMI) myocardial infarction of unspecified site: Secondary | ICD-10-CM | POA: Insufficient documentation

## 2014-09-20 DIAGNOSIS — Z87448 Personal history of other diseases of urinary system: Secondary | ICD-10-CM | POA: Diagnosis not present

## 2014-09-20 DIAGNOSIS — I252 Old myocardial infarction: Secondary | ICD-10-CM | POA: Insufficient documentation

## 2014-09-20 DIAGNOSIS — F329 Major depressive disorder, single episode, unspecified: Secondary | ICD-10-CM | POA: Insufficient documentation

## 2014-09-20 DIAGNOSIS — I10 Essential (primary) hypertension: Secondary | ICD-10-CM | POA: Diagnosis not present

## 2014-09-20 DIAGNOSIS — M1990 Unspecified osteoarthritis, unspecified site: Secondary | ICD-10-CM | POA: Insufficient documentation

## 2014-09-20 DIAGNOSIS — I213 ST elevation (STEMI) myocardial infarction of unspecified site: Secondary | ICD-10-CM

## 2014-09-20 LAB — CBC WITH DIFFERENTIAL/PLATELET
BASOS ABS: 0 10*3/uL (ref 0.0–0.1)
Basophils Relative: 0 % (ref 0–1)
Eosinophils Absolute: 0 10*3/uL (ref 0.0–0.7)
Eosinophils Relative: 0 % (ref 0–5)
HCT: 42.3 % (ref 36.0–46.0)
Hemoglobin: 13.9 g/dL (ref 12.0–15.0)
Lymphocytes Relative: 26 % (ref 12–46)
Lymphs Abs: 0.7 10*3/uL (ref 0.7–4.0)
MCH: 27.4 pg (ref 26.0–34.0)
MCHC: 32.9 g/dL (ref 30.0–36.0)
MCV: 83.4 fL (ref 78.0–100.0)
MONOS PCT: 7 % (ref 3–12)
Monocytes Absolute: 0.2 10*3/uL (ref 0.1–1.0)
NEUTROS PCT: 67 % (ref 43–77)
Neutro Abs: 1.7 10*3/uL (ref 1.7–7.7)
PLATELETS: 67 10*3/uL — AB (ref 150–400)
RBC: 5.07 MIL/uL (ref 3.87–5.11)
RDW: 22.3 % — AB (ref 11.5–15.5)
WBC: 2.6 10*3/uL — AB (ref 4.0–10.5)

## 2014-09-20 LAB — COMPREHENSIVE METABOLIC PANEL
ALBUMIN: 2.3 g/dL — AB (ref 3.5–5.2)
ALT: UNDETERMINED U/L (ref 0–35)
AST: UNDETERMINED U/L (ref 0–37)
Alkaline Phosphatase: 152 U/L — ABNORMAL HIGH (ref 39–117)
Anion gap: 19 — ABNORMAL HIGH (ref 5–15)
BUN: 69 mg/dL — ABNORMAL HIGH (ref 6–23)
CALCIUM: 8.2 mg/dL — AB (ref 8.4–10.5)
CO2: 10 mmol/L — CL (ref 19–32)
CREATININE: 4.37 mg/dL — AB (ref 0.50–1.10)
Chloride: 99 mmol/L (ref 96–112)
GFR calc Af Amer: 10 mL/min — ABNORMAL LOW (ref >90)
GFR calc non Af Amer: 9 mL/min — ABNORMAL LOW (ref >90)
Glucose, Bld: 29 mg/dL — CL (ref 70–99)
Potassium: 6.2 mmol/L (ref 3.5–5.1)
Sodium: 128 mmol/L — ABNORMAL LOW (ref 135–145)
Total Bilirubin: UNDETERMINED mg/dL (ref 0.3–1.2)
Total Protein: 4.9 g/dL — ABNORMAL LOW (ref 6.0–8.3)

## 2014-09-20 LAB — BASIC METABOLIC PANEL
Anion Gap: 17 — ABNORMAL HIGH (ref 7–16)
BUN: 68 mg/dL — ABNORMAL HIGH
CO2: 15 mmol/L — AB
CREATININE: 4.18 mg/dL — AB
Calcium, Total: 8.8 mg/dL — ABNORMAL LOW
Chloride: 98 mmol/L — ABNORMAL LOW
EGFR (African American): 11 — ABNORMAL LOW
GFR CALC NON AF AMER: 10 — AB
Glucose: 45 mg/dL — ABNORMAL LOW
Potassium: 6.1 mmol/L — ABNORMAL HIGH
SODIUM: 130 mmol/L — AB

## 2014-09-20 LAB — I-STAT TROPONIN, ED: TROPONIN I, POC: 2.18 ng/mL — AB (ref 0.00–0.08)

## 2014-09-20 LAB — TROPONIN I: Troponin-I: 2.26 ng/mL — ABNORMAL HIGH

## 2014-09-20 LAB — CBC
HCT: 45.1 % (ref 35.0–47.0)
HGB: 14.4 g/dL (ref 12.0–16.0)
MCH: 26.9 pg (ref 26.0–34.0)
MCHC: 31.9 g/dL — AB (ref 32.0–36.0)
MCV: 84 fL (ref 80–100)
Platelet: 85 10*3/uL — ABNORMAL LOW (ref 150–440)
RBC: 5.35 10*6/uL — ABNORMAL HIGH (ref 3.80–5.20)
RDW: 23.5 % — AB (ref 11.5–14.5)
WBC: 1.6 10*3/uL — CL (ref 3.6–11.0)

## 2014-09-20 LAB — PROTIME-INR
INR: 2.23 — ABNORMAL HIGH (ref 0.00–1.49)
Prothrombin Time: 24.9 seconds — ABNORMAL HIGH (ref 11.6–15.2)

## 2014-09-20 LAB — I-STAT CHEM 8, ED
BUN: 63 mg/dL — ABNORMAL HIGH (ref 6–23)
CREATININE: 4.1 mg/dL — AB (ref 0.50–1.10)
Calcium, Ion: 1.03 mmol/L — ABNORMAL LOW (ref 1.13–1.30)
Chloride: 102 mmol/L (ref 96–112)
Glucose, Bld: 29 mg/dL — CL (ref 70–99)
HCT: 48 % — ABNORMAL HIGH (ref 36.0–46.0)
Hemoglobin: 16.3 g/dL — ABNORMAL HIGH (ref 12.0–15.0)
POTASSIUM: 5.8 mmol/L — AB (ref 3.5–5.1)
Sodium: 128 mmol/L — ABNORMAL LOW (ref 135–145)
TCO2: 11 mmol/L (ref 0–100)

## 2014-09-20 LAB — CK-MB: CK-MB: 15.9 ng/mL — AB

## 2014-09-20 LAB — PRO B NATRIURETIC PEPTIDE: B-Type Natriuretic Peptide: >4858 pg/mL

## 2014-09-20 LAB — CBG MONITORING, ED: GLUCOSE-CAPILLARY: 131 mg/dL — AB (ref 70–99)

## 2014-09-20 LAB — I-STAT CG4 LACTIC ACID, ED: Lactic Acid, Venous: 8.65 mmol/L (ref 0.5–2.0)

## 2014-09-20 SURGERY — LEFT HEART CATH
Anesthesia: LOCAL

## 2014-09-20 MED ORDER — EPINEPHRINE HCL 0.1 MG/ML IJ SOSY
PREFILLED_SYRINGE | INTRAMUSCULAR | Status: AC | PRN
  Administered 2014-09-20: 1 mg via INTRAVENOUS

## 2014-09-20 MED ORDER — PHENYLEPHRINE 40 MCG/ML (10ML) SYRINGE FOR IV PUSH (FOR BLOOD PRESSURE SUPPORT)
PREFILLED_SYRINGE | INTRAVENOUS | Status: AC
  Filled 2014-09-20: qty 30

## 2014-09-20 MED ORDER — ROCURONIUM BROMIDE 50 MG/5ML IV SOLN
INTRAVENOUS | Status: AC | PRN
Start: 2014-09-20 — End: 2014-09-20
  Administered 2014-09-20: 80 mg via INTRAVENOUS

## 2014-09-20 MED ORDER — ETOMIDATE 2 MG/ML IV SOLN
INTRAVENOUS | Status: AC | PRN
  Administered 2014-09-20: 15 mg via INTRAVENOUS

## 2014-09-20 MED ORDER — CALCIUM CHLORIDE 10 % IV SOLN
INTRAVENOUS | Status: AC | PRN
  Administered 2014-09-20: 1 g via INTRAVENOUS

## 2014-09-20 MED ORDER — MIDAZOLAM HCL 2 MG/2ML IJ SOLN
INTRAMUSCULAR | Status: AC
  Filled 2014-09-20: qty 2

## 2014-09-21 NOTE — ED Provider Notes (Signed)
CSN: [id number]     Arrival date & time 02-Oct-2014  1959 History   First MD Initiated Contact with Patient October 02, 2014 2045     Chief Complaint  Patient presents with  . Code STEMI  . Cardiac Arrest     (Consider location/radiation/quality/duration/timing/severity/associated sxs/prior Treatment) HPI   74 y/o F w/ mult comorbid conditions as documented below, presents as a code stemi  Patient presented to OSH earlier today w/ chest pain. Found to have anterior STEMI she was given heparin/brilinta and transferred to [facility] as code stemi.  En route to the hospital she decompensated and just before entering the ED she started having agonal breathing and was less responsive.    On arrival in the ED, she is minimally responsive and unable to offer any hx.  Past Medical History  Diagnosis Date  . Hypertension   . Rheumatoid arthritis(714.0) 1959  . History of bladder infections   . Anxiety   . Depression   . Kidney infection   . Dysphagia   . CAD (coronary artery disease)   . Ischemic cardiomyopathy     EF 40%  . CHF (congestive heart failure)   . Anemia   . Hyperlipidemia   . Myocardial infarction    Past Surgical History  Procedure Laterality Date  . Foot surgery     No family history on file. History  Substance Use Topics  . Smoking status: Never Smoker   . Smokeless tobacco: Not on file  . Alcohol Use: No   OB History    No data available     Review of Systems  Unable to perform ROS: Mental status change      Allergies  Bystolic; Clonidine derivatives; Diltiazem; Hctz; Lisinopril; Metoprolol; Shellfish allergy; Tekturna; and Valsartan  Home Medications   Prior to Admission medications   Medication Sig Start Date End Date Taking? Authorizing Provider  ALPRAZolam Prudy Feeler) 0.25 MG tablet Take 1/2 tablet by mouth every 8 hours 12/17/13   Sharee Holster, NP  aspirin 325 MG tablet Take 325 mg by mouth daily.    Historical Provider, MD  potassium chloride SA  (K-DUR,KLOR-CON) 20 MEQ tablet Take 2 tablets (40 mEq total) by mouth daily. 11/19/13   Sharee Holster, NP  predniSONE (DELTASONE) 20 MG tablet Take 2 tablets (40 mg total) by mouth daily with breakfast. 11/19/13   Sharee Holster, NP  simvastatin (ZOCOR) 10 MG tablet Take 5 mg by mouth daily at 6 PM.    Historical Provider, MD  torsemide (DEMADEX) 20 MG tablet Take 2 tablets (40 mg total) by mouth daily. 12/17/13   Sharee Holster, NP  traMADol Janean Sark) 50 MG tablet Take 1/2 tablet by mouth every 6 hours 12/17/13   Sharee Holster, NP   BP 79/49 mmHg  Pulse 26  Resp 0  Wt 134 lb (60.782 kg)  SpO2 56% Physical Exam  Constitutional:  Chronically ill appearing  HENT:  Head: Atraumatic.  Eyes: Conjunctivae are normal. Pupils are equal, round, and reactive to light.  Neck: Normal range of motion. Neck supple.  Cardiovascular:  tachy  Pulmonary/Chest:  Agonal shallow breathing  Abdominal: She exhibits no distension. There is no guarding.  Musculoskeletal:  motionless  Neurological:  Unresponsive, no response to painful stimuli, pupils reactive.  Skin: No rash noted.  Vitals reviewed.   ED Course  INTUBATION Date/Time: 09/21/2014 3:28 AM Performed by: Silas Flood Authorized by: Silas Flood Consent: The procedure was performed in an emergent situation. Indications: airway protection  and  respiratory failure Intubation method: direct Patient status: paralyzed (RSI) Preoxygenation: BVM Sedatives: etomidate Paralytic: rocuronium Laryngoscope size: Mac 3 Tube size: 7.5 mm Tube type: cuffed Number of attempts: 1 Cricoid pressure: no Cords visualized: yes Post-procedure assessment: chest rise and ETCO2 monitor Breath sounds: equal Cuff inflated: yes ETT to teeth: 22 cm Tube secured with: ETT holder Patient tolerance: Patient tolerated the procedure well with no immediate complications  CENTRAL LINE Date/Time: 09/21/2014 3:32 AM Performed by: Silas Flood Authorized by:  Silas Flood Consent: The procedure was performed in an emergent situation. Indications: vascular access Preparation: skin prepped with 2% chlorhexidine Skin prep agent dried: skin prep agent completely dried prior to procedure Location details: left femoral Catheter type: triple lumen Catheter size: 7 Fr Pre-procedure: landmarks identified Ultrasound guidance: no Number of attempts: 1 Assessment: blood return through all ports and free fluid flow   (including critical care time) Labs Review Labs Reviewed  COMPREHENSIVE METABOLIC PANEL - Abnormal; Notable for the following:    Sodium 128 (*)    Potassium 6.2 (*)    CO2 10 (*)    Glucose, Bld 29 (*)    BUN 69 (*)    Creatinine, Ser 4.37 (*)    Calcium 8.2 (*)    Total Protein 4.9 (*)    Albumin 2.3 (*)    Alkaline Phosphatase 152 (*)    GFR calc non Af Amer 9 (*)    GFR calc Af Amer 10 (*)    Anion gap 19 (*)    All other components within normal limits  CBC WITH DIFFERENTIAL/PLATELET - Abnormal; Notable for the following:    WBC 2.6 (*)    RDW 22.3 (*)    Platelets 67 (*)    All other components within normal limits  PROTIME-INR - Abnormal; Notable for the following:    Prothrombin Time 24.9 (*)    INR 2.23 (*)    All other components within normal limits  I-STAT CHEM 8, ED - Abnormal; Notable for the following:    Sodium 128 (*)    Potassium 5.8 (*)    BUN 63 (*)    Creatinine, Ser 4.10 (*)    Glucose, Bld 29 (*)    Calcium, Ion 1.03 (*)    Hemoglobin 16.3 (*)    HCT 48.0 (*)    All other components within normal limits  I-STAT CG4 LACTIC ACID, ED - Abnormal; Notable for the following:    Lactic Acid, Venous 8.65 (*)    All other components within normal limits  I-STAT TROPOININ, ED - Abnormal; Notable for the following:    Troponin i, poc 2.18 (*)    All other components within normal limits  CBG MONITORING, ED - Abnormal; Notable for the following:    Glucose-Capillary 131 (*)    All other components  within normal limits    Imaging Review No results found.   EKG Interpretation None      MDM   Final diagnoses:  None    74 y/o F w/ mult comorbid conditions presents as a code stemi from OSH  Just PTA pt became unresponsive, agonal breathing.  Upon arrival, we immediately started assisted ventilations with BVM.  Patient moved over to the bed from EMS stretcher.  Unable to palpate a pulse, CPR was started.  One round of CPR w/ epi.  Unable to palpate pulse after first found, patient in PEA arrest.  After second round of CPR, able to feel pulse.  Patient continued  to have agonal, shallow breathing and was intubated as documented above with etomidate and roc.  Femoral CVC placed.   Cardiology consultant at bedside and had discussion with patients brother regarding goals of care.  Patient is chronically ill at baseline and having sig hemodynamic collapse after cardiac arrest upon arrival which was precipitated by STEMI.  Given very poor prognosis, the patients brother decided it would be within the patients wishes to transition goals of care to comfort measures.   Given rocuronium given within the past half hour, will continue assisted ventilation on ventilator, have given versed for comfort.  Will hold off on any further vasoactive medications.  At 2053/10/30, patient noted to have sig slowing of QRS complexes on the monitor.  Bedside u/s performed by my attending showed no cardiac activity, no palpable pulse.  Time of death was 2053-10-30.    Silas Flood, MD 09/21/14 2122  Silas Flood, MD 09/21/14 4825  Tilden Fossa, MD 09/25/14 [phone]

## 2014-09-22 MED FILL — Medication: Qty: 1 | Status: AC

## 2014-09-24 NOTE — ED Notes (Signed)
Time of death 2055, pronounced by Dr. Madilyn Hook.

## 2014-09-24 NOTE — ED Notes (Signed)
No pulses present.   Dr. Madilyn Hook at bedside with [place].  No cardiac activity.

## 2014-09-24 NOTE — Progress Notes (Signed)
Chaplain responded to Code STEMI for pt coming from [facility] ED. Learned from Dr. Excell Seltzer that pt's brother was en route. Attempted to meet him in ED waiting area but it turned out he walked directly into ED through EMS entrance. Chaplain provided emotional support for pt's brother as he learned from Dr. Excell Seltzer that pt was not doing well and would likely pass soon. Took pt's brother to Trauma B to be at bedside with pt for a few minutes. Continued to provide empathic listening and caring conversation for pt's brother until pt passed. We then had prayer together. Chaplain secured brother's contact info and funeral home info and passed it on to nurse. Pt's brother expressed appreciation for chaplain support.

## 2014-09-24 NOTE — ED Notes (Signed)
Dr. Edwyna Perfect at bedside setting up a-line.

## 2014-09-24 NOTE — Code Documentation (Signed)
Family updated as to patient's status.

## 2014-09-24 NOTE — Code Documentation (Signed)
Patient time of death occurred at 10/18/53 .

## 2014-09-24 NOTE — ED Notes (Signed)
Family at bedside. 

## 2014-09-24 NOTE — ED Notes (Signed)
Patient arrives with agonal breathing, no pulse.  Patient left [place] ED as a Code STEMI.  Patient went apneic as they were unloaded patient from ambulance.

## 2014-09-24 NOTE — Consult Note (Signed)
CARDIOLOGY CONSULT NOTE  Patient ID: Melody Garrison, MRN: [id number], DOB/AGE: [birth year] 74 y.o. Admit date: Sep 21, 2014 Date of Consult: September 21, 2014  Primary Physician: Lorie Phenix, MD Primary Cardiologist: Dr Gwen Pounds Referring Physician: Dr Fanny Bien  Chief Complaint: Chest pain  Reason for Consultation: STEMI, collapse  HPI: 74 year-old chronically ill woman, SNF resident, who was evaluated at [facility] Emergency Department with chest pain today. EKG showed an anterior STEMI and she was referred emergently as a Code STEMI. Unfortunately the patient had a cardiopulmonary arrest en route and required CPR on arrival to the ER here at [facility]. She has received IV heparin and brilinta at [facility], and has required epinephrine for hemodynamic collapse. Now intubated in the ER.   History obtrained from the patient's brother. She lives in a SNF and has had declining health over the past 6 months. She is severely limited from rheumatoid arthritis at baseline. She's developed nonhealing wounds on her legs. Also underwent cardiac cath last year at [facility] showing chronic occlusion of the RCA with medical therapy recommended.   Apparently she had chest pain today, prompting EMS transport to [facility]. No hx obtainable from the patient.   Medical History:  Past Medical History  Diagnosis Date  . Hypertension   . Rheumatoid arthritis(714.0) 1959  . History of bladder infections   . Anxiety   . Depression   . Kidney infection   . Dysphagia   . CAD (coronary artery disease)   . Ischemic cardiomyopathy     EF 40%  . CHF (congestive heart failure)   . Anemia   . Hyperlipidemia   . Myocardial infarction       Surgical History:  Past Surgical History  Procedure Laterality Date  . Foot surgery       Home Meds: Prior to Admission medications   Medication Sig Start Date End Date Taking? Authorizing Provider  ALPRAZolam Prudy Feeler) 0.25 MG tablet Take 1/2 tablet by mouth every 8 hours 12/17/13   Sharee Holster, NP  aspirin 325 MG tablet Take 325 mg by mouth daily.    Historical Provider, MD  potassium chloride SA (K-DUR,KLOR-CON) 20 MEQ tablet Take 2 tablets (40 mEq total) by mouth daily. 11/19/13   Sharee Holster, NP  predniSONE (DELTASONE) 20 MG tablet Take 2 tablets (40 mg total) by mouth daily with breakfast. 11/19/13   Sharee Holster, NP  simvastatin (ZOCOR) 10 MG tablet Take 5 mg by mouth daily at 6 PM.    Historical Provider, MD  torsemide (DEMADEX) 20 MG tablet Take 2 tablets (40 mg total) by mouth daily. 12/17/13   Sharee Holster, NP  traMADol Janean Sark) 50 MG tablet Take 1/2 tablet by mouth every 6 hours 12/17/13   Sharee Holster, NP    Inpatient Medications:  . midazolam      . phenylephrine          Allergies:  Allergies  Allergen Reactions  . Bystolic [Nebivolol Hcl]   . Clonidine Derivatives   . Diltiazem   . Hctz [Hydrochlorothiazide]   . Lisinopril     Nerve problems    . Metoprolol     REACTION: fatique  . Shellfish Allergy   . Tekturna [Aliskiren]   . Valsartan     Nerve problems    History   Social History  . Marital Status: Widowed    Spouse Name: N/A  . Number of Children: N/A  . Years of Education: N/A   Occupational History  .  Not on file.   Social History Main Topics  . Smoking status: Never Smoker   . Smokeless tobacco: Not on file  . Alcohol Use: No  . Drug Use: No  . Sexual Activity: Not on file   Other Topics Concern  . Not on file   Social History Narrative     No family history on file.   Review of Systems: Unable to obtain  Physical Exam: Blood pressure 36/17, pulse 84, resp. rate 14, SpO2 63 %. Pt is intubated, unresponsive.  HEENT: normal Lungs: equal expansion, clear bilaterally CV: distant, regular, no murmur Abd: soft Ext: cool, faint femoral pulses on my exam. Diffuse edema, cyanosis Skin: warm and dry without rash     Labs: No results for input(s): CKTOTAL, CKMB, TROPONINI in the last 72 hours. Lab  Results  Component Value Date   WBC 2.6* 09-30-14   HGB 16.3* 2014-09-30   HCT 48.0* September 30, 2014   MCV 83.4 2014-09-30   PLT PENDING 2014/09/30     Recent Labs Lab 09/30/2014 2023  NA 128*  K 5.8*  CL 102  BUN 63*  CREATININE 4.10*  GLUCOSE 29*   No results found for: CHOL, HDL, LDLCALC, TRIG No results found for: DDIMER  Radiology/Studies:  No results found.  EKG: NSR with RBBB and anterior ST elevation c/w anterior STEMI  Cardiac Studies: none  ASSESSMENT AND PLAN:  Chronically-ill 74 year-old woman with cardiac arrest and anterior STEMI. Unfortunately despite aggressive efforts at resuscitation, she has continued to decline in the Emergency Department. I had a lengthy discussion with the patient's brother who is her POA. Considering her poor functional status and difficult quality of life, also advanced renal disease with acute on chronic renal failure in setting of a solitary kidney, and severe shock/cardiac arrest, we have decided comfort care is most appropriate and would be in line with the patient's wishes. Her chance of surviving this event, even with the most aggressive care, would be exceedingly low and likely would be fraught with multiorgan failure and prolonged suffering. Chaplain present for our discussion and is present to offer support to the patient's brother.   Signed, Tonny Bollman MD, [facility] September 30, 2014, 8:58 PM

## 2014-09-24 NOTE — ED Notes (Addendum)
Patient placement called.  Patient on the way to morgue.  Post mortem care complete.  Belongings secured and are with patient in a labeled belonging bag.

## 2014-09-24 NOTE — ED Notes (Signed)
Brother has decided that withdrawing care would be the patient's wishes.

## 2014-09-24 NOTE — ED Notes (Signed)
Dr. Excell Seltzer at bedside after speaking with brother.  Brother wants to make patient DNR, patient's pressure is dropping.  Last BP was 36/17 as measured with BP cuff.  A line not set up yet as of now.  Pulse oximetry is not getting a good pleth.  Faint carotid pulse are present as of now.  Dr. Edwyna Perfect at bedside

## 2014-09-24 DEATH — deceased

## 2014-09-30 ENCOUNTER — Encounter (HOSPITAL_BASED_OUTPATIENT_CLINIC_OR_DEPARTMENT_OTHER): Payer: PRIVATE HEALTH INSURANCE

## 2014-10-11 ENCOUNTER — Encounter (HOSPITAL_BASED_OUTPATIENT_CLINIC_OR_DEPARTMENT_OTHER): Payer: PRIVATE HEALTH INSURANCE

## 2014-10-16 NOTE — Consult Note (Signed)
PATIENT NAME:  Melody Garrison, Melody Garrison MR#:  [id number] DATE OF BIRTH:  [birth year]  DATE OF CONSULTATION:  11/11/2013  REQUESTING PHYSICIAN:  Dr. Juliene Pina CONSULTING PHYSICIAN:  Scott C. Stoioff, MD  REASON FOR CONSULTATION: Staghorn calculus and chronic UTI.   CHIEF COMPLAINT: A 74 year old female presented to the Emergency Department on 11/08/2013 with a 4-5 week history of generalized weakness with progressive nausea and decreased p.o. intake. She denies previous urologic problems or prior urological evaluation; however, states she has had chronic bacteriuria/UTIs for the past 2 years although it is asymptomatic. She denied flank or abdominal pain. She denies urinary frequency, urgency, dysuria, or incontinence. She was found to have a pyuria on admission and Proteus UTI.  A recent renal ultrasound showed no hydronephrosis; however, multiple echogenic foci on the left kidney felt consistent with a staghorn stone. She denies previous history of stone disease.   PAST MEDICAL HISTORY:  1. Rheumatoid arthritis.  2. Coronary artery disease - recent left heart catheterization showed complete occlusion of the proximal and mid RCA. Ejection fraction was 40%.  3. Hypertension.   PAST SURGICAL HISTORY: None.   MEDICATION ON ADMISSION:  Chlorthalidone 6.2 mg daily.   ALLERGIES: MULTIPLE ALLERGIES/INTOLERANCES TO HYDROCHLOROTHIAZIDE, BYSTOLIC, DIOVAN, LISINOPRIL, DILTIAZEM, TEKTURNA, CLONIDINE, BETA BLOCKERS, AND SHELLFISH.   SOCIAL HISTORY: Denies tobacco, alcohol, or illicit drug use. The patient is widowed.   REVIEW OF SYSTEMS:  CONSTITUTIONAL: Positive weakness, fatigue.  EYES: No visual changes.  EARS, NOSE, AND THROAT: No hearing loss, tinnitus.  PULMONARY: Positive shortness of breath with exertion. CARDIOVASCULAR:   Denies chest pain, palpitations.  GASTROINTESTINAL: Positive nausea.  GENITOURINARY:  As per the HPI.  ENDOCRINE: Negative diabetes, polydipsia.  HEMATOLOGIC: No history of bleeding or  clotting disorders.  DERMATOLOGY: No rash.  MUSCULOSKELETAL: Multiple joint pain/swelling secondary to rheumatoid arthritis.  NEUROLOGIC: Negative CVA/ TIA.  PHYSICAL EXAMINATION: VITAL SIGNS:  Temperature 98.4, pulse 84, blood pressure 175/84.  GENERAL: Pleasant female in no acute distress.  ABDOMEN: Soft, nontender without masses.  BACK: No CVA tenderness.   DATA: Urine culture growing 50,000 colonies of Proteus mirabilis which is resistant only to nitrofurantoin. Blood cultures are negative.  Creatinine is 2.37.  Renal ultrasound was reviewed. No hydronephrosis. Multiple echogenic foci of the left kidney.   IMPRESSION:  1. 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  2. Chronic bacteriuria/recurrent urinary tract infections secondary to above.   RECOMMENDATION:  1. KUB was ordered.  2. The best treatment for a staghorn stone would be percutaneous nephrolithotomy.  Her stone does not need to be treated acutely, and after discharge, we will refer to the tertiary center of her choice.     ____________________________ Verna Czech Lonna Cobb, MD scs:dd D: 11/11/2013 17:29:57 ET T: 11/11/2013 18:31:04 ET JOB#: [id number]  cc: Lorin Picket C. Lonna Cobb, MD, <Dictator> Riki Altes MD ELECTRONICALLY SIGNED 11/18/2013 15:21

## 2014-10-16 NOTE — Discharge Summary (Signed)
PATIENT NAME:  Melody Garrison, Melody Garrison MR#:  [id number] DATE OF BIRTH:  Oct 26, 1940  DATE OF ADMISSION:  11/08/2013 DATE OF DISCHARGE:  11/12/2013  ADMISSION DIAGNOSIS:  1.  Acute renal failure.   DISCHARGE DIAGNOSES: 1.  Sepsis secondary to urinary tract infection. 2.  Proteus urinary tract infection.  3.  Acute renal failure.   4.  Hyponatremia.   CONSULTATIONS: 1.  GI.  2.  Dr. Thedore Mins.  3.  Dr. Lonna Cobb.  LABORATORY  DATA AT DISCHARGE:  Sodium 130, potassium 3.8, chloride 100, bicarb 19, BUN 61, creatinine 2.2, glucose 120.   HOSPITAL COURSE: This is a 74 year old female who presented with sepsis and urinary tract infection. For further details, please refer to the H and P. 1.  Sepsis secondary to urinary tract infection. The patient was placed on Rocephin for urinary tract infection.    2.  Proteus urinary tract infection. The patient has had chronic urinary tract infections and renal ultrasound was performed, which showed a staghorn calculus. Obtained consultation with urology. This is likely the cause of her chronic urinary tract infections.  Dr. Lonna Cobb  recommended outpatient followup for possible percutaneous lithotripsy.  3.  Acute renal failure, multifactorial, secondary to contrast-induced nephropathy with acute tubular necrosis and prerenal azotemia with nausea, vomiting and diarrhea. The patient's creatinine seems to have slowly improving.  We appreciate nephrology consultation. The patient will have followup with nephrology.  4.  Hyponatremia. The patient may have chronic low sodium at baseline, as her sodium remained stable.  5.  Elevated liver enzymes from dehydration.  6.  Ischemic cardiomyopathy, ejection fraction of 40%. She had a recent cardiac catheterization  which showed an occluded RCA. She is euvolemic and she is very sensitive to any medications, but we did discharge her with aspirin and Imdur, which she was tolerating here.  7.  History of rheumatoid  arthritis/osteoarthritis. The patient has significant swelling of the joints. She received Decadron in the Emergency Room. Her pain is now controlled. I did not add any steroids. She does not want rheumatology followup at discharge.  8.  Severe debility. PT recommended SNF.  9.  Diarrhea, seems to have resolved when she arrived.  Her C. diff was negative. 10.  Edema secondary to malnutrition from low albumin.  11.  Dysphagia to solids.  The patient is complaining of some dysphagia to solids; however, now, she is able to eat. We did obtain a GI consultation. Barium swallow evaluation is pending at this time, but as per my conversation with GI,  the patient can have followup as an outpatient. She will need cardiac approval prior to any invasive workup if that is needed.     DISCHARGE MEDICATIONS: 1.  Imdur 30 mg daily.  2.  Simvastatin 5 mg daily. 3.  Aspirin 325 mg daily.  4.  Keflex 500 mg p.o. t.i.d. x 5 days. 5.  Tylenol 325, 2 tablets q.4 hours p.r.n. pain.   DISCHARGE DIET:  Low fat, low cholesterol.   DISCHARGE ACTIVITY:  As tolerated.   DISCHARGE FOLLOWUP:  Follow up in 1 week with Dr. Thedore Mins, in 1 week with Dr. Lonna Cobb and in 1 week with Dr. Elease Hashimoto and Dr. Servando Snare.   Chronic obstructive pulmonary disease Spiller second Clent Ridges Dr. Mosetta Pigeon Dr. Lorie Phenix.   TIME SPENT: 35 minutes.   It the patient has a bed today at a skilled nursing facility, she will be discharged.   ____________________________ Janyth Contes. Juliene Pina, MD spm:dmm D: 11/12/2013 11:57:08 ET T: 11/12/2013  12:33:44 ET JOB#: [id number]  cc: Jiya Kissinger P. Juliene Pina, MD, <Dictator> Scott C. Lonna Cobb, MD Joselyn Arrow, NP Midge Minium, MD Mosetta Pigeon, MD Leo Grosser, MD Janyth Contes Jermeka Schlotterbeck MD ELECTRONICALLY SIGNED 11/12/2013 16:43

## 2014-10-16 NOTE — Consult Note (Signed)
PATIENT NAME:  Melody Garrison, Melody Garrison MR#:  [id number] DATE OF BIRTH:  [birth year]  GASTROINTESTINAL CONSULTATION   DATE OF CONSULTATION:  11/12/2013  REFERRING PHYSICIAN:  Sital P. Juliene Pina, MD CONSULTING PHYSICIAN:  Melody Arrow, NP  PRIMARY CARE PHYSICIAN: Melody Grosser, MD  REASON FOR CONSULTATION: Dysphagia.  HISTORY OF PRESENT ILLNESS: Melody Garrison is a 74 year old female with history of chronic rheumatoid arthritis. She underwent a cardiac catheterization a couple weeks ago and developed severe diarrhea. She became dehydrated and had chronic nausea and gagging. She had 100% occlusion of her proximal and mid RCA with an ejection fraction of 40%. She was placed on medical management. She was taking Imodium for her diarrhea at home, but appetite became worse and worse. She denies any history of heartburn or indigestion, but she has had increased belching. She denies any coughing or choking episodes; however, she is noting that her food seems to get stuck supraesophageal. At times, she will regurgitate within a few minutes of swallowing. She denies any problems with liquids. Her weight has remained stable. She denies any NSAIDs except for an occasional aspirin. She has never had an EGD or colonoscopy. She has responded well to treatment for sepsis and is feeling much better.   PAST MEDICAL AND SURGICAL HISTORY: Rheumatoid arthritis, coronary artery disease status post catheterization, ischemic heart disease with ejection fraction of 40%, hypertension, chronic UTIs, staghorn calculus on the left kidney, hyperlipidemia.   MEDICATIONS PRIOR TO ADMISSION:  1. Acetaminophen 325 two tablets q.4 hours p.r.n. 2. Aspirin 325 mg daily.  3. Cephalexin 500 mg 3 times a day for 5 days.  4. Isosorbide mononitrate 30 mg daily.  5. Simvastatin 5 mg at bedtime.   ALLERGIES: BETA BLOCKERS CAUSED SWELLING. BYSTOLIC CAUSED ANXIETY. CLONIDINE, DIZZINESS. DILTIAZEM, COUGH. DIOVAN, ANXIETY. HYDROCHLOROTHIAZIDE,  AGITATION. LISINOPRIL, COUGH. TEKTURNA, DIZZINESS AND FAINTING. SHELLFISH CAUSES SWELLING.   SOCIAL HISTORY: She is a widow. She lives alone. She has 1 healthy daughter. She is disabled, previously a Lawyer. She denies any tobacco, alcohol or illicit drug use.   FAMILY HISTORY: There is no known family history of colon carcinoma, liver or chronic GI problems.   REVIEW OF SYSTEMS:  MUSCULOSKELETAL: She does have chronic joint pain and swelling.  HEMATOLOGY: She has multiple bruises with easy bruising and bleeding.   PHYSICAL EXAMINATION:  VITAL SIGNS: Temperature 97.4, pulse 83, respirations 20, blood pressure 145/81, O2 saturation 95% on room air.  GENERAL: She is an elderly Caucasian female who is alert, oriented, pleasant, cooperative, in no acute distress. She is accompanied by her brother.  HEENT: Sclerae clear, nonicteric. Conjunctivae pink. Oropharynx pink and moist, without any lesions.  NECK: Supple, without mass or thyromegaly.  CHEST: Heart regular rate and rhythm. Normal S1, S2, without murmurs, clicks, rubs or gallops.  LUNGS: Clear to auscultation bilaterally.  ABDOMEN: Positive bowel sounds x4. No bruits auscultated. Abdomen is soft, nontender, nondistended. No palpable hepatosplenomegaly or mass. No rebound tenderness or guarding.  EXTREMITIES: She has ulnar deviation, significant joint swelling in both hands and ecchymosis to both hands and upper extremities. She has 2+ pretibial edema bilaterally.  NEUROLOGIC: Grossly intact. Strength: Good equal strength and movement bilaterally.  PSYCHIATRIC: She is alert, cooperative, normal mood and affect.   LABORATORY STUDIES: Her BNP was greater than 175,000. She had a glucose of 109, BUN 61, creatinine 2.2, sodium 130, CO2 19 and calcium 8.4, otherwise normal basic metabolic panel. Her AST was elevated at 183, ALT elevated at 240, albumin 2.3  and total protein 5.8, otherwise normal LFTs. She had an elevated troponin of 0.13.  White blood cell count was 19.1 yesterday, hemoglobin was 11.1, platelets 252. She had a C. difficile stool which was positive for toxins A and B, negative for GDH antigen and toxigenic C. difficile. Her serum cortisol was elevated at 61.3. She had a chest x-ray which showed cardiomegaly and mild bilateral edema with minimal bilateral pleural effusions. She had an ultrasound of the kidney which showed a left staghorn calculus. She had a KUB which showed a large staghorn calculus in the left kidney and sclerotic island on the right superior pubic bone   IMPRESSION: Melody Garrison is a very pleasant 74 year old Caucasian female with progressive solid food dysphagia. She has history of disabling rheumatoid arthritis, cardiomegaly, coronary artery disease and ischemic heart disease with an ejection fraction of 40%, on aspirin and Lovenox currently. She will have an upper gastrointestinal barium study with barium pill to differentiate structural esophageal problems such as web or ring versus motility disorder.   PLAN:  1. Upper GI with barium pill.  2. If EGD needed for dilation, she will need to be off aspirin and Lovenox and have cardiac clearance. This may be able to be done on an outpatient basis, if cardiology agrees.  3. Further recommendations pending x-ray.  Thank you for allowing [place] to participate in her care.   ____________________________ Melody Arrow, NP klj:lb D: 11/12/2013 11:32:00 ET T: 11/12/2013 12:09:34 ET JOB#: [id number]  cc: Melody Arrow, NP, <Dictator> Melody Grosser, MD Melody Arrow FNP ELECTRONICALLY SIGNED 11/17/2013 15:21

## 2014-10-16 NOTE — H&P (Signed)
PATIENT NAME:  Melody Garrison, STALLONE MR#:  [id number] DATE OF BIRTH:  1940-11-06  DATE OF ADMISSION:  11/08/2013  PRIMARY CARE PHYSICIAN: Dr. Lorie Phenix   REFERRING PHYSICIAN: Dr. Lowella Fairy     CHIEF COMPLAINT: Severe generalized weakness, nausea, decreased p.o. intake.   HISTORY OF PRESENT ILLNESS: The patient is a 74 year old pleasant female with history of severe rheumatoid arthritis for the last 50 years, has not been on any treatment, with severe deformities of the hands, comes to the Emergency Department with not feeling well for the last 4-5 weeks. The patient's initial symptoms started with shortness of breath with exertion with lower extremity swelling. Concerning this, the patient was seen by primary care physician and subsequently referred to cardiology. The patient underwent left heart catheterization about 4 weeks back and was found to have 100% occlusion of the proximal and mid RCA with EF of 40%.  Patient was placed on nitroglycerin.  The patient states that since then patient has been experiencing severe nausea, had episodes of diarrhea. The patient was given Imodium with improvement of the diarrhea; however, patient continued to have severe nausea, decreased p.o. intake, severe generalized weakness. The patient at baseline was living independently, was independent of ADLs and IADLs, was driving, and spending time with her friends.  However, this is a significant change from her baseline. The patient is to the point that unable to walk even inside the house. Concerning this, patient's brother brought her to the Emergency Department.   WORKUP IN THE EMERGENCY DEPARTMENT: Patient is found to have urinary tract infection. The patient states patient has been treated with multiple doses of antibiotics over the last 2 years without any success of eliminating the urinary tract infection. The patient does not remember culture reports of her urine. The patient is also found to have multiple other  lab abnormalities including acute renal failure with a BUN 52, creatinine of 2.25. The patient states was never told that patient has any kidney dysfunction. The patient is also found to have mild elevation of the LFTs. The patient complains of severe pain in multiple joints. The patient states was seen by a rheumatologist about 2 years back and was placed on pills which patient could not tolerate and patient did not follow up with the rheumatologist. The patient is also found to have albumin level of 2.8. The patient currently denies having any diarrhea; however, has severe nausea with significant decrease in appetite. In the Emergency Department, blood and urine cultures have been sent. The patient received 1 dose of Zosyn in the Emergency Department.   PAST MEDICAL HISTORY:  1. Rheumatoid arthritis.   Currently not on any medication.  2. Coronary artery disease status post a recent left heart catheterization, finding of complete occlusion of the proximal and mid RCA with other nonobstructive lesions of 30%- 40% in multiple vessels.  3. Hypertension on chlorthalidone.   PAST SURGICAL HISTORY: None.   ALLERGIES: THE PATIENT HAS MULTIPLE ALLERGIES TO HYDROCHLOROTHIAZIDE, BYSTOLIC, DIOVAN, LISIONPRIL, DILTIAZEM, TEKTURNA, CLONIDINE, BETA BLOCKER, SHELLFISH.   HOME MEDICATIONS:  6.2 mg of chlorthalidone.   SOCIAL HISTORY: No history of smoking, drinking alcohol, or using illicit drugs. Patient is  a widow, lives by herself, was independent ADLs and IADLs until 2 months back.   FAMILY HISTORY: Heart problems.   REVIEW OF SYSTEMS:  CONSTITUTIONAL: Severe generalized weakness, fatigue.  EYES: No change in vision.  EARS, NOSE, AND THROAT:  No change in hearing.  RESPIRATORY: Has shortness of breath with  exertion.  CARDIOVASCULAR: No chest pain, palpitations.  GASTROINTESTINAL: Has nausea. Denies any abdominal pain.  GENITOURINARY: Has some dysuria.  ENDOCRINE: No polyuria or polydipsia.   HEMATOLOGY: No easy bruising or bleeding.  SKIN: No rash or lesions.  MUSCULOSKELETAL:  Joint pains diffusely.  NEUROLOGIC: No weakness or numbness in any part of the body.   PHYSICAL EXAMINATION:  GENERAL: Well-built, well-nourished, age-appropriate female lying down in the bed, not in distress.  VITAL SIGNS: Temperature 98.1, pulse 100, blood pressure 127/86, respiratory rate of 26, oxygen saturation 97% on room air.  HEENT: Head normocephalic, atraumatic. Eyes:  No scleral icterus. Conjunctivae normal. Pupils equal and react to light. Extraocular movements are intact. Mucous membranes: Mild dryness. No pharyngeal erythema.  NECK: Supple. No lymphadenopathy. No JVD. No carotid bruit. No thyromegaly.  CHEST: Has no focal tenderness.  LUNGS: Bilaterally clear to auscultation.  HEART: S1, S2, regular, tachycardia.  ABDOMEN: Bowel sounds present. Soft, nontender, nondistended. Could not appreciate any hepatosplenomegaly.  EXTREMITIES: 3 to 4+ pitting edema extending up to the knees.  SKIN: No rash or lesions.  MUSCULOSKELETAL: Severe deformity of both hands with  deviation in both hands.   DIAGNOSTIC DATA:  Urinalysis: 3+ leukocyte esterase, WBC of 177. Chest x-ray, 1 view portable: Cardiomegaly with mild interstitial edema.  Minimal bilateral pleural effusion.  Troponin 0.13. CMP: BUN 52, creatinine of 2.24, sodium of 127, chloride 94, albumin 2.8, ALT 247, AST 285. BNP more than 175,000.   ASSESSMENT AND PLAN: The patient is a 74 year old female that comes with sepsis from urinary tract infection and is also found to have multiple other laboratory abnormalities.  1. Sepsis secondary to urinary tract infection. Obtain urine cultures, blood cultures. Keep the patient on Rocephin.  2. Urinary tract infection. Follow up with urine cultures. Continue with Rocephin.  3. Acute renal failure. This seems to be multifactorial. Possibility of contrast-induced nephropathy followed by patient's nausea,  vomiting, and diarrhea also could have caused dehydration as patient also takes chlorthalidone. We will continue with the gentle hydration and followup. We will obtain ultrasound of the kidneys.  4. Hyponatremia. Most likely secondary to chlorthalidone and also the patient's nausea for the last 4 weeks. Discontinue the chlorthalidone. Continue with the gentle hydration and followup. We will also check the urine sodium, which may not be informative as the patient is on chlorthalidone.  5. Elevated liver enzymes, possibly from the shock liver from sepsis. We will continue to follow up.  6. Ischemic cardiomyopathy with ejection fraction of 40%. The patient seems to be more on the dry side rather than with fluid overload.  7. Rheumatoid arthritis/osteoarthritis flare-up. The patient does not have any significant swelling of the hands and legs; however, the patient has diffuse pain in all the joints. The patient received Decadron in the Emergency Department. Continue with the steroids as this pain is causing severe debility.  Even though patient has severe rheumatoid arthritis, considering the patient's age, the patient might have also developed osteoarthritis. I recommended patient to follow up with rheumatology.  8. Severe debility.  We will involve physical therapy.  9. Lower extremity edema. This could be a combination of malnutrition and hypoalbuminemia and the congestive heart failure.  10. Keep the patient on deep vein thrombosis prophylaxis with Lovenox.   TIME SPENT: 55 minutes.    ____________________________ Susa Griffins, MD pv:dd D: 11/08/2013 22:16:00 ET T: 11/08/2013 23:18:47 ET JOB#: [id number]  cc: Leo Grosser, MD Susa Griffins, MD, <Dictator>    [facility] Shakesha Soltau  MD ELECTRONICALLY SIGNED 11/20/2013 0:43

## 2014-10-16 NOTE — Consult Note (Signed)
Brief Consult Note: Diagnosis: Dysphagia.   Patient was seen by consultant.   Comments: Ms. Josten is a very pleasant 74 y/o caucasian female with progressive solid food dysphagia.  She has hx of disabling RA, cardiomegaly, CAD, ischemic heart disease with EF 40% on ASA & lovenox currently.  She will have UGI with Ba pill to differentiate structural esophageal problem such as web or ring versus motility disorder.   Plan: 1) UGI with BPE 2) If EGD is needed for dilation, she will need to be off ASA/lovenox & have cardiac clearance 3) Further recommendations pending xray Thanks for allowing [place] to participate in her care.  Please see full dictated note. #[id number].  Electronic Signatures: Joselyn Arrow (NP)  (Signed 21-May-15 11:32)  Authored: Brief Consult Note   Last Updated: 21-May-15 11:32 by Joselyn Arrow (NP)

## 2014-10-24 NOTE — Discharge Summary (Signed)
PATIENT NAME:  Melody Garrison, Melody Garrison MR#:  [id number] DATE OF BIRTH:  1940/10/04  DATE OF ADMISSION:  07/02/2014 DATE OF DISCHARGE:  07/07/2014  ADMITTING DIAGNOSIS: Cellulitis.   DISCHARGE DIAGNOSES:  1. Left lower extremity methicillin-sensitive Staphylococcus aureus cellulitis with pustule, which now formed a draining ulcer. 2. Bilateral feet ulcerations at the metatarsal heads and heels, both pressure as well as due to suspected peripheral artery disease. The patient refused angiogram.  3. 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  4. Elevated troponin, likely demand ischemia per cardiology.  5. Acute on chronic renal failure, resolved.  6. Leukocytosis.  7. Thrombocytopenia.  8. Generalized weakness.  9. History of rheumatoid arthritis, history of coronary artery disease, hypotension as well as glaucoma.   DISCHARGE CONDITION: Stable.   DISCHARGE MEDICATIONS: The patient is to continue her outpatient medications which are: 1. Simvastatin 5 mg p.o. at bedtime. 2. Torsemide 10 mg p.o. twice daily. 3. Potassium chloride 20 mEq once daily. 4. Senna 8.6 mg once at bedtime.  5. Plaquenil 200 mg p.o. daily. 6. Tylenol 325 mg 2 tablets every 4 hours as needed.  7. Aspirin 325 mg p.o. daily.  8. Remeron 15 mg p.o. daily. 9. Pantoprazole 40 mg p.o. daily. 10. Midodrine 5 mg p.o. 3 times daily.  11. Imodium 2 mg every 4 hours as needed. 12. Iron sulfate 325 mg p.o. 3 times daily with meals. 13. Travatan 0.004% ophthalmic solution 1 drop to left eye once a day at bedtime.  14. Docusate sodium 100 mg 3 times daily as needed.  15. Oxycodone 5 mg every 4 hours as needed.  16. Tramadol 50 mg half tablet every 6 hours as needed.  17. Alprazolam 0.5 mg 3 times daily as needed.  18. Collagenase 250 units in 1 gram topical ointment applied topically to affected area once daily.  19. Ensure Plus 237 mL twice daily.  20. Keflex 250 mg 3 times daily for 12 more days.   HOME OXYGEN: None.    DIET: 2 grams salt, low-fat, low-cholesterol, regular consistency.   ACTIVITY LIMITATIONS: As tolerated.   REFERRALS: To physical therapy, occupational therapy as well as wound care.   FOLLOWUP APPOINTMENTS:  With Dr. Elease Hashimoto in 2 days after discharge.   WOUND CARE:  Cleanse sacrum with normal saline and pat gently dry, lightly fill wound bed with iodoform packing strip, including tunneling and undermining. Cover with dry dressing and change daily. Cleanse ulcers to left foot near the metatarsal heads and bilateral heels with normal saline and pat gently dry. Apply Santyl to wound bed, cover with normal saline moist dressing and top with 4 x 4 gauze and Kerlix. Change daily. Offload the heel pressure with Prevalon boots.   CONSULTANTS: Care management, social work, wound care, Festus Barren, MD, vascular surgery and  Marcina Millard, MD, cardiology.   RADIOLOGIC STUDIES: Chest x-ray, portable single view, 07/02/2014, showed the cardiomegaly, but no frank interstitial edema, small bilateral pleural effusions. Ultrasound of left lower extremity, Doppler ultrasound, 07/02/2014, revealed no evidence of deep vein thrombosis.  HOSPITAL COURSE:  The patient is a 74 year old Caucasian female with past medical history significant for history of rheumatoid arthritis, history of coronary artery disease, who presents to the hospital with complaints of left leg swelling as well as redness. Please refer to Dr. Clarita Leber admission note on 07/02/2014. On arrival to the hospital, the patient was noted to have erythema as well as a few pustules on the left lower extremity, anterior aspect of the leg,  which was spreading. The patient's thigh was swollen, tender to palpation and was marked. The patient did have also left foot chronic ulcerations and the right foot heel ulceration, as well as sacral ulcer, which were present on admission.  The patient's lab data on arrival to the hospital, 07/02/2014, revealed BUN  and creatinine of 61 and 2.59, potassium 3.3, glucose 170, otherwise BMP was unremarkable. The patient's cardiac enzymes showed mild elevation of troponin to 0.07 on the first set, 0.06 on the second, and 0.06 on the third as well as fourth sets. MB fraction was not checked, as well as CK total. White blood cell count was elevated to 15.2, hemoglobin was 12.4, platelet count 141,000. Absolute neutrophil count was high at 13.6. Blood cultures taken on 07/02/2014 did not show any growth. Wound cultures taken on 07/03/2014 revealed Staphylococcus aureus, heavy growth, sensitive to all antibiotics, cefoxitin screen was negative and inducible clindamycin resistance was also negative, although wound cultures were not finalized yet since they were holding for possible additional pathogens.  The patient was initiated on broad-spectrum antibiotic therapy initially, and her condition improved. Her wound retracted and drained. The white blood cell count also improved.   On 07/06/2014, her white blood cell count was within normal limits of 6.7, and absolute neutrophil count was also normal at 5.0. The patient was advised to continue antibiotic therapy for 12 more days to complete a 2 week course. Because of her severe bilateral feet wounds, vascular surgery was consulted. Vascular surgeon, Dr. Wyn Quaker, saw patient in consultation and felt that the patient has the risk of losing limbs because of her wounds, unless investigated with angiogram and fixed because of concerns and high suspicion of peripheral artery disease. But because of patient's severe kidney disease as well as severe reaction to IV dye, patient, as well as patient's family refused this study.   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 The patient was recommended to continue iodoform packing strips to fill wound bed including tunneling and undermining, and to  cover with dry dressing and change daily, per wound nurse. Maple Hudson. She also did not feel that the patient would benefit from a wound VAC at this point, although according to discussion with the patient's family, it appears that the patient does have an appointment with [facility] plastic surgeon for possible wound VAC placement. The patient was evaluated by the physical therapist and recommended a skilled nursing facility placement where she will be discharged today on 07/07/2014.   On the day of discharge, her vitals were stable with temperature of 97.5, pulse was 74, respiration rate was 18 to 19, blood pressure 136/83, saturation was 96% on room air at rest.   TIME SPENT: 40 minutes.    ____________________________ Katharina Caper, MD rv:kl D: 07/07/2014 12:48:14 ET T: 07/07/2014 13:31:13 ET JOB#: [id number]  cc: Katharina Caper, MD, <Dictator> Shameer Molstad MD ELECTRONICALLY SIGNED 07/15/2014 10:08

## 2014-10-24 NOTE — Consult Note (Signed)
brief note here, full note to followulcerations and sacral decubitus.  allergy to dye that is severe and severe CKDfoot ulcer more worrisome, although right heel ulcer present too.arterial duplex available here, so may need angiogram.  recommend several days of hydration and try to treat her dye allergy as aggressive as possible.be difficult situation and her legs will likely need to be treated in a staged fashionschedule LLE angiogram for Thursday if felt safe by primary service.  Can plan premedication for allergy and renal issues around this.  Electronic Signatures: Annice Needy (MD)  (Signed on 11-Jan-16 15:06)  Authored  Last Updated: 11-Jan-16 15:06 by Annice Needy (MD)

## 2014-10-24 NOTE — Consult Note (Signed)
PATIENT NAME:  Melody Garrison, Melody Garrison MR#:  [id number] DATE OF BIRTH:  [birth year]  DATE OF CONSULTATION:  07/03/2014  REFERRING PHYSICIAN:   CONSULTING PHYSICIAN:  Marcina Millard, MD  PRIMARY CARE PHYSICIAN: Leo Grosser, MD  CARDIOLOGIST: Lamar Blinks, MD    CHIEF COMPLAINT: Left leg redness and swelling.   REASON FOR CONSULTATION: Consultation requested for evaluation for borderline elevated troponin.   HISTORY OF PRESENT ILLNESS: The patient is a 74 year old female with long-standing history of rheumatoid arthritis with multiple deformities of hands and feet. 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 She was noted to have swelling and redness extending from her foot halfway up her thigh. She was brought to [facility] Emergency Room where she was diagnosed with cellulitis. Admission laboratory was remarkable for elevated white count of 15,200, BUN and creatinine of 61 and 2.59, respectively. Troponin was borderline elevated at 0.07 which remained unchanged on repeat evaluation. The patient denies chest pain.   PAST MEDICAL HISTORY: 1.  The patient underwent cardiac catheterization 04/15, which apparently revealed occluded mid RCA, complicated by contrast allergy.  2.  Diffuse rheumatoid arthritis.  3.  Hypertension.  4.  Chronic kidney disease with single kidney.   MEDICATIONS: Aspirin 325 mg daily, torsemide 20 mg t.i.d., simvastatin 5 mg daily, potassium chloride 20 mEq daily, Travatan eye drops, tramadol 50 mg q. 6 p.r.n., senna plus 1 daily, plaque 200 mg daily, Protonix 40 mg daily, oxycodone 5 mg q. 4 hours p.r.n., mirtazapine 15 mg daily, midodrine 5 mg t.i.d., Imodium 2 mg q. 4 hours p.r.n., ferrous sulfate 325 mg daily, alprazolam 0.5 mg t.i.d.   SOCIAL HISTORY: The patient currently resides in a nursing home. She denies tobacco or EtOH abuse.   FAMILY HISTORY: No immediate family history of coronary artery disease or  myocardial infarction.   REVIEW OF SYSTEMS:  CONSTITUTIONAL: The patient has some generalized weakness.  EYES: No blurry vision.  EARS: No hearing loss.  RESPIRATORY: No shortness of breath.  CARDIOVASCULAR: No chest pain.  GASTROINTESTINAL: No nausea, vomiting, or diarrhea.  GENITOURINARY: No dysuria or hematuria.  ENDOCRINE: No polyuria or polydipsia.  MUSCULOSKELETAL: The patient has diffuse rheumatoid arthritis.  INTEGUMENTARY: The patient has redness extending from her left foot up her left thigh.  NEUROLOGICAL: No focal muscle weakness or numbness.  PSYCHOLOGICAL: No depression or anxiety.   PHYSICAL EXAMINATION: VITAL SIGNS: Blood pressure 136/79, pulse 84, respirations 18, temperature 97.6, pulse oximetry 97%.  HEENT: Pupils equal and reactive to light and accommodation.  NECK: Supple without thyromegaly.  LUNGS: Clear.  CARDIOVASCULAR: Normal JVP. Normal PMI. Regular rate and rhythm. Normal S1, S2. No appreciable gallop, murmur, or rub.  ABDOMEN: Soft and nontender. Pulses were intact bilaterally.  MUSCULOSKELETAL: Normal muscle tone.  NEUROLOGIC: The patient is alert and oriented x 3. Motor and sensory both grossly intact.   IMPRESSION: A 74 year old female with diffuse rheumatoid arthritis, nonhealing ulcer left foot, left lower extremity cellulitis, as well as chronic kidney disease. The patient has borderline elevated troponin likely due to demand supply ischemia and not due to acute coronary syndrome. The patient would be at very high risk for invasive cardiac evaluation in light of chronic kidney disease and history of contrast-induced allergic reaction.   RECOMMENDATIONS: 1.  Continue current therapy.  2.  Defer full dose anticoagulation.  3.  Would defer further cardiac diagnostics at this time.    ____________________________ Marcina Millard, MD ap:at  D: 07/03/2014 12:36:32 ET T: 07/03/2014 14:35:36 ET JOB#: [id number]  cc: Marcina Millard, MD,  <Dictator> Marcina Millard MD ELECTRONICALLY SIGNED 07/14/2014 18:06

## 2014-10-24 NOTE — H&P (Signed)
PATIENT NAME:  Melody Garrison, Melody Garrison MR#:  [id number] DATE OF BIRTH:  Nov 15, 1940  DATE OF ADMISSION:  07/02/2014  PRIMARY CARE PHYSICIAN:  Leo Grosser, MD    REFERRING PHYSICIAN:  Gladstone Pih, MD    CHIEF COMPLAINT:  Left leg swelling and redness.    HISTORY OF PRESENT ILLNESS:  Melody Garrison is a 75 year old female with history of rheumatoid arthritis over the last 50 years with significant deformities of the hands and legs, peripheral vascular disease, coronary artery disease with mid RCA occlusion, hypertension who lives in a nursing home with chronic ulcer of the left foot, was noticed to have significant swelling and redness of the left thigh in the last 2 days.  The redness has been significantly getting worse.  The patient denies having any pain.  Concerning this patient is sent to the Emergency Department.    Workup in the Emergency Department with venous Dopplers was negative for any DVT.  The patient has significant redness and swelling of the left medial thigh.  Denies having any injury.  The patient is noticed to have elevated WBC count of 15,000 with 90% left shift.  The patient is also found to have elevated BUN and creatinine.  The patient received vancomycin and Zosyn in the Emergency Department.    PAST MEDICAL HISTORY:   1.  Rheumatoid arthritis, on Plaquenil.   2.  Coronary artery disease, status post stent placement with finding of complete occlusion of the proximal mid RCA with other obstructive lesions of 30 to 40%.   3.  Hypertension.    PAST SURGICAL HISTORY:  None.    ALLERGIES:  HYDROCHLOROTHIAZIDE, BYSTOLIC, DIOVAN, LISINOPRIL, DILTIAZEM, , CLONIDINE, BETA BLOCKER, SHELLFISH.    HOME MEDICATIONS:   1.  Travatan eyedrops.   2.  Tramadol 50 mg every 6 hours as needed.   3.  Torsemide 20 mg 3 times a day.   4.  Simvastatin 5 mg once a day.   5.  Senna Plus once a day.   6.  Potassium chloride 20 mEq once a day.   7.  Plaquenil 200 mg once a day.   8.  Protonix 40  mg once a day.   9.  Oxycodone 5 mg every 4 hours as needed.   10.  Mirtazapine 15 mg once a day.   11.  Midodrine 5 mg 3 times a day.   12.  Imodium 2 mg every 4 hours as needed.   13.  Ferrous sulfate 325 mg 3 times a day.    14.  Aspirin 325 mg once a day.   15.  Alprazolam 0.5 mg 3 times a day.    SOCIAL HISTORY:   No history of smoking, drinking alcohol, or using illicit drugs.  Currently living in a nursing home.    FAMILY HISTORY:   Heart problems.    REVIEW OF SYSTEMS:  CONSTITUTIONAL: Experiencing severe generalized weakness.  EYES: No change in vision.  EARS, NOSE, AND THROAT: No change in hearing.  RESPIRATORY: No cough, shortness of breath.  CARDIOVASCULAR: No chest pain, palpitations.  GASTROINTESTINAL: No nausea, vomiting, abdominal pain.  GENITOURINARY: No dysuria or hematuria.  HEMATOLOGIC: No easy bruising or bleeding.  SKIN: Has a significant redness and swelling on the left thigh as well as left foot.  NEUROLOGIC: No weakness or numbness in any part of the body.   PHYSICAL EXAMINATION:   GENERAL: This is an elderly female laying down in the bed not in distress.  VITAL SIGNS:  Temperature 97.4, pulse 77, blood pressure 130/58, respiratory rate of 28, oxygen saturation is 94% on room air.  HEENT: Head normocephalic, atraumatic. There is no scleral icterus. Conjunctivae normal. Pupils equal and react to light. Mucous membranes mild dryness. No pharyngeal erythema.  NECK: Supple. No lymphadenopathy. No JVD. No carotid bruit. No thyromegaly.   CHEST: No focal tenderness.  LUNGS: Bilaterally clear to auscultation.  HEART: S1, S2 regular. No murmurs are heard.  ABDOMEN: Bowel sounds plus. Soft, nontender, nondistended. No hepatosplenomegaly.   EXTREMITIES: Left lower extremity with significant redness and swelling of the left medial thigh, pretty much the entire thigh, which has been marked.  Also, left foot with chronic ulceration of the plantar aspect of the second and  third digit.   NEUROLOGIC: The patient is alert, oriented to place, person, and time. Cranial nerves II through XII intact. Motor 5/5 in upper and lower extremities.   LABORATORY DATA:  BMP:  BUN 61, creatinine of 2.59, potassium 3.3. Rest of all the values are within normal limits.    CBC:  WBC of 15.2, hemoglobin 12.4, platelet count of 141.    Troponin 0.07.  Chest x-ray, 1 view, portable, no acute cardiopulmonary disease.    Ultrasound of the lower extremity:  No evidence of DVT.    ASSESSMENT AND PLAN:  Melody Garrison is a 74 year old female who comes with left lower extremity cellulitis.   1.  Left lower extremity cellulitis.  Considering patient's immunosuppressive state, keep the patient on vancomycin and Zosyn.  We will closely follow up with the vancomycin level considering patient's renal insufficiency.   2.  Left foot ulcer.  Consult wound care.   3.  History of rheumatoid arthritis.  Hold the Plaquenil.   4.  History of coronary artery disease, status post stent placement.  Continue the simvastatin, Imdur and aspirin.   5.  Debility.  We will involve physical therapy, occupational therapy.   6.  Chronic renal insufficiency.  This seems to be at baseline.  We will continue to follow.   7.  Hypokalemia.  We will replace by mouth.    TIME SPENT:  55 minutes.    ____________________________ Susa Griffins, MD pv:AT D: 07/03/2014 04:29:00 ET T: 07/03/2014 05:00:06 ET JOB#: [id number]  cc: Susa Griffins, MD, <Dictator> Leo Grosser, MD Susa Griffins MD ELECTRONICALLY SIGNED 07/03/2014 23:54

## 2014-10-24 NOTE — Consult Note (Signed)
CHIEF COMPLAINT and HISTORY:  Subjective/Chief Complaint BLE ulcerations, cellulitis   History of Present Illness Patient admitted three days ago with cellulitis and ulceration of the LLE.  She describes what sounds like a severe allergic reaction to dye about a month ago and this resulted in swelling all over.  She has multiple ongoing issues and is on Plaquenil for RA which she has had for about 50 years.  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  She reports little pain in her feet outside of her arthritis pain.  Her ulcer on her left medial first MTP joint had purulent drainage and she had cellulitis going up to the mid calf at presentation.  Would culture currenlty groing staph.  No obvious inciting events, but appears to be a pressure spot with deformity in the foot. Elevation has helped the swelling some.  also has a right heel ulcer that is a pressure ulcer.  In addition, has a sacral decubitus.  Would service consulted as well.  Has a known history of CAD and suspected PAD, so we are consulted for further evaluation and treatment of PAD.   PAST MEDICAL/SURGICAL HISTORY:  Past Medical History:   MI:    rheumatoid arthritis:    htn:    Cardiac Stents:   ALLERGIES:  Allergies:  Beta Blockers: Swelling  Lisinopril: Cough  Diltiazem: Cough  Bystolic: Anxiety  Diovan: Anxiety  Clonidine: Dizzy/Fainting, Anxiety  Hydrochlorothiazide: Agitation  Tekturna: Dizzy/Fainting  Shellfish: Swelling  HOME MEDICATIONS:  Home Medications: Medication Instructions Status  simvastatin 5 mg oral tablet 1 tab(s) orally once a day (at bedtime) Active  torsemide 20 mg oral tablet 1 tab(s) orally 2 times a day Active  ALPRAZolam 0.5 mg oral tablet 1 tab(s) orally 3 times a day Active  oxyCODONE 5 mg oral tablet 1 tab(s) orally every 4 hours, As Needed - for Pain Active  potassium chloride 20 mEq oral tablet, extended release 1 tab(s) orally once a day Active  Senna 8.6 mg oral tablet 1 tab(s)  orally once a day (at bedtime) Active  Plaquenil 200 mg oral tablet 1 tab(s) orally once a day Active  Mapap 325 mg oral tablet 2 tab(s) orally every 4 hours, As Needed - for Pain, for Fever greater than 100.4  Active  aspirin 325 mg oral tablet 1 tab(s) orally once a day Active  mirtazapine 15 mg oral tablet 1 tab(s) orally once a day Active  pantoprazole 40 mg oral delayed release tablet 1 tab(s) orally once a day Active  midodrine 5 mg oral tablet 1 tab(s) orally 3 times a day Active  Imodium 2 mg oral tablet 1 tab(s) orally every 4 hours, As Needed - for Diarrhea Active  ferrous sulfate 325 mg oral tablet 1 tab(s) orally 3 times a day (with meals) Active  traMADol 50 mg oral tablet 0.5 tab(s) orally every 6 hours, As Needed - for Pain Active  Travatan 0.004% ophthalmic solution 1 drop(s) to left eye once a day (at bedtime) Active  DOK sodium 100 mg oral capsule 1 cap(s) orally 3 times a day, As Needed - for Constipation Active   Family and Social History:  Family History Non-Contributory   Social History negative tobacco, negative ETOH, negative Illicit drugs   Place of Living Home   Review of Systems:  Subjective/Chief Complaint No TIA/stroke/seizure No heat or cold intolerance Longstanding joint pain from RA No dysuria/hematuria No blurry or double vision No tinnitus or ear pain Cellulitis LE and  BLE and sacral ulcers.   Fever/Chills Yes   Cough No   Sputum No   Abdominal Pain No   Diarrhea No   Constipation No   Nausea/Vomiting No   SOB/DOE No   Chest Pain No   Telemetry Reviewed NSR   Dysuria No  CKD   Tolerating PT Yes   Tolerating Diet Yes   Medications/Allergies Reviewed Medications/Allergies reviewed   Physical Exam:  GEN well developed, well nourished   HEENT pink conjunctivae, moist oral mucosa   RESP normal resp effort  no use of accessory muscles   CARD regular rate  LE edema present  no JVD   VASCULAR ACCESS none present   ABD  denies tenderness  soft  normal BS   GU no superpubic tenderness   LYMPH negative neck, negative axillae   EXTR negative cyanosis/clubbing, positive edema, 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  PT pulse 1+ on the right and trace on the left.  DP pulse not easily palpable bilaterally.  1-2+ LE edema present.   SKIN positive ulcers, stasis changes present bilaterally.  Erythema present to lower calf on left.  Ulcer on medial first MTP joint on left about 2 cm, mild drainage.  Right heel ulcer about 1-2 cm and clean appearing.  Sacral ulcer currently dressed.   NEURO cranial nerves intact, follows commands, motor/sensory function intact   PSYCH alert, A+O to time, place, person, good insight   LABS:  Laboratory Results: LabObservation:    08-Jan-16 21:34, [place] Color Flow Doppler Low Extrem Left (Leg)  OBSERVATION   Reason for Test left leg swelling and redness  Hepatic:    09-Jan-16 03:53, Hepatic Function Panel A  Bilirubin, Total 1.1  Bilirubin, Direct 0.5  Result(s) reported on 03 Jul 2014 at 12:29PM.  Alkaline Phosphatase 155  46-116  NOTE: New Reference Range  01/12/14  SGPT (ALT) 40  14-63  NOTE: New Reference Range  01/12/14  SGOT (AST) 35  Total Protein, Serum 5.9  Albumin, Serum 2.5    10-Jan-16 03:40, Comprehensive Metabolic Panel  Bilirubin, Total 1.1  Alkaline Phosphatase 142  46-116  NOTE: New Reference Range  01/12/14  SGPT (ALT) 32  14-63  NOTE: New Reference Range  01/12/14  SGOT (AST) 27  Total Protein, Serum 6.2  Albumin, Serum 2.6  LabUnknown:    09-Jan-16 18:48, Wound Aerobic Culture  Ind. Clindamycin Resistance NEGATIVE  Routine Micro:    08-Jan-16 18:24, Blood Culture  Micro Text Report   BLOOD CULTURE    COMMENT                   NO GROWTH IN 48 HOURS     ANTIBIOTIC  Specimen Source r ac  Culture Comment   NO GROWTH IN 48 HOURS   Result(s) reported on 04 Jul 2014 at 07:00PM.    08-Jan-16 19:56, Blood Culture  Micro Text Report    BLOOD CULTURE    COMMENT                   NO GROWTH IN 48 HOURS     ANTIBIOTIC  Culture Comment   NO GROWTH IN 48 HOURS   Result(s) reported on 04 Jul 2014 at 08:00PM.    09-Jan-16 18:48, Wound Aerobic Culture  Organism Name   Staphylococcus aureus  Organism Quantity   HEAVY GROWTH  Clindamycin Sensitivity S  Oxacillin Sensitivity S  Ciprofloxacin Sensitivity S  Gentamicin Sensitivity S  Erythromycin Sensitivity S  Levofloxacin Sensitivity  S  Linezolid Sensitivity S  Tigecycline Sensitivity S  Trimethoprim/Sulfamethoxazole Sensitivty S  Micro Text Report   WOUND AEROBIC CULTURE    ORGANISM 1                HEAVY GROWTH Staphylococcus aureus    GRAM STAIN                MANY WHITE BLOOD CELLS    GRAM STAIN                MANY GRAM POSITIVE COCCI IN PAIRS/CLUSTERS     ANTIBIOTIC                    ORG#1    CIPROFLOXACIN                 S          CLINDAMYCIN                   S          ERYTHROMYCIN                  S          GENTAMICIN                    S          LEVOFLOXACIN                  S          LINEZOLID                     S          OXACILLIN                     S          TIGECYCLINE                   S          Cefoxitin Screen              NEGATIVE   Inducible Clindamycin ResistanNEGATIVE   Trimethoprim/Sulfamethoxazole S  Specimen Source   LLE PUSTULE  Organism 1   HEAVY GROWTH Staphylococcus aureus  Culture Comment   SENSITIVITIES TO FOLLOW  Culture Comment .   HOLDING FOR POSSIBLE SECOND PATHOGEN  Gram Stain 1   MANY WHITE BLOOD CELLS  Gram Stain 2   MANY GRAM POSITIVE COCCI IN PAIRS/CLUSTERS   Result(s) reported on 05 Jul 2014 at 12:35PM.  Routine Chem:    08-Jan-16 61:44, Basic Metabolic Panel (w/Total Calcium)  Glucose, Serum 170  BUN 61  Creatinine (comp) 2.59  Sodium, Serum 140  Potassium, Serum 3.3  Chloride, Serum 102  CO2, Serum 26  Calcium (Total), Serum 8.5  Anion Gap 12  Osmolality (calc) 301  eGFR (African American) 23   eGFR (Non-African American) 19  eGFR values <71m/min/1.73 m2 may be an indication of chronic  kidney disease (CKD).  Calculated eGFR, using the MRDR Study equation, is useful in   patients with stable renal function.  The eGFR calculation will not be reliable in acutely ill patients  when serum creatinine is changing rapidly. It is not useful in  patients on dialysis. The eGFR calculation may not be applicable  to patients at the low and high extremes of body sizes, pregnant  women, and vegetarians.  Result  Comment   HGB/HCT - RESULTS VERIFIED BY REPEAT TESTING.   Result(s) reported on 02 Jul 2014 at 07:03PM.    08-Jan-16 18:24, Troponin I  Result Comment   TROPONIN - RESULTS VERIFIED BY REPEAT TESTING.   - [facility] CALLED MICHELLE MORTON @ 1932 07-02-14   - READ-BACK PROCESS PERFORMED.   Result(s) reported on 02 Jul 2014 at 07:36PM.    09-Jan-16 24:23, Basic Metabolic Panel (w/Total Calcium)  Glucose, Serum 79  BUN 61  Creatinine (comp) 2.32  Sodium, Serum 139  Potassium, Serum 3.2  Chloride, Serum 105  CO2, Serum 24  Calcium (Total), Serum 8.3  Anion Gap 10  Osmolality (calc) 294  eGFR (African American) 26  eGFR (Non-African American) 22  eGFR values <22m/min/1.73 m2 may be an indication of chronic  kidney disease (CKD).  Calculated eGFR, using the MRDR Study equation, is useful in   patients with stable renal function.  The eGFR calculation will not be reliable in acutely ill patients  when serum creatinine is changing rapidly. It is not useful in  patients on dialysis. The eGFR calculation may not be applicable  to patients at the low and high extremes of body sizes, pregnant  women, and vegetarians.    09-Jan-16 03:53, CBC Profile  Result Comment   HGB/HCT - RESULTS VERIFIED BY REPEAT TESTING.   Result(s) reported on 03 Jul 2014 at 04:29AM.    09-Jan-16 03:53, Troponin I  Result Comment   Troponin - RESULTS VERIFIED BY REPEAT TESTING.   - Elevated troponin  previously called @   - 1932 07/02/14 by [facility] - [facility].   Result(s) reported on 03 Jul 2014 at 08:06AM.    09-Jan-16 12:09, Troponin I  Result Comment   TROPONIN - RESULTS VERIFIED BY REPEAT TESTING.   - PREVIOUSLY CALLED ON 07/02/14 @ 1932 BY   - [facility]...qsd   Result(s) reported on 03 Jul 2014 at 01:04PM.    09-Jan-16 15:25, Troponin I  Result Comment   TROPONIN - RESULTS VERIFIED BY REPEAT TESTING.   - PREVIOUSLY CALLED TO MICHELLE MORTON   - ON 07/02/14 AT 1932 BY [facility]...SDR   Result(s) reported on 03 Jul 2014 at 04:24PM.    10-Jan-16 03:40, Comprehensive Metabolic Panel  Glucose, Serum 108  BUN 59  Creatinine (comp) 2.38  Sodium, Serum 138  Potassium, Serum 3.5  Chloride, Serum 101  CO2, Serum 25  Calcium (Total), Serum 8.3  Osmolality (calc) 293  eGFR (African American) 26  eGFR (Non-African American) 21  eGFR values <652mmin/1.73 m2 may be an indication of chronic  kidney disease (CKD).  Calculated eGFR, using the MRDR Study equation, is useful in   patients with stable renal function.  The eGFR calculation will not be reliable in acutely ill patients  when serum creatinine is changing rapidly. It is not useful in  patients on dialysis. The eGFR calculation may not be applicable  to patients at the low and high extremes of body sizes, pregnant  women, and vegetarians.  Anion Gap 12  Cardiac:    08-Jan-16 18:24, Troponin I  Troponin I 0.07  0.00-0.05  0.05 ng/mL or less: NEGATIVE   Repeat testing in 3-6 hrs   if clinically indicated.  >0.05 ng/mL: POTENTIAL   MYOCARDIAL INJURY. Repeat   testing in 3-6 hrs if   clinically indicated.  NOTE: An increase or decrease   of 30% or more on serial   testing suggests a   clinically important change    09-Jan-16 03:53,  Troponin I  Troponin I 0.06  0.00-0.05  0.05 ng/mL or less: NEGATIVE   Repeat testing in 3-6 hrs   if clinically indicated.  >0.05 ng/mL: POTENTIAL   MYOCARDIAL INJURY. Repeat   testing in 3-6 hrs if    clinically indicated.  NOTE: An increase or decrease   of 30% or more on serial   testing suggests a   clinically important change    09-Jan-16 12:09, Troponin I  Troponin I 0.06  0.00-0.05  0.05 ng/mL or less: NEGATIVE   Repeat testing in 3-6 hrs   if clinically indicated.  >0.05 ng/mL: POTENTIAL   MYOCARDIAL INJURY. Repeat   testing in 3-6 hrs if   clinically indicated.  NOTE: An increase or decrease   of 30% or more on serial   testing suggests a   clinically important change    09-Jan-16 15:25, Troponin I  Troponin I 0.06  0.00-0.05  0.05 ng/mL or less: NEGATIVE   Repeat testing in 3-6 hrs   if clinically indicated.  >0.05 ng/mL: POTENTIAL   MYOCARDIAL INJURY. Repeat   testing in 3-6 hrs if   clinically indicated.  NOTE: An increase or decrease   of 30% or more on serial   testing suggests a   clinically important change  Routine Hem:    08-Jan-16 18:24, CBC Profile  WBC (CBC) 15.2  RBC (CBC) 4.93  Hemoglobin (CBC) 12.4  Hematocrit (CBC) 40.8  Platelet Count (CBC) 141  MCV 83  MCH 25.1  MCHC 30.3  RDW 23.5  Neutrophil % 89.7  Lymphocyte % 6.9  Monocyte % 2.4  Eosinophil % 0.4  Basophil % 0.6  Neutrophil # 13.6  Lymphocyte # 1.1  Monocyte # 0.4  Eosinophil # 0.1  Basophil # 0.1  Result(s) reported on 02 Jul 2014 at 06:57PM.    09-Jan-16 03:53, CBC Profile  WBC (CBC) 14.6  RBC (CBC) 4.77  Hemoglobin (CBC) 12.0  Hematocrit (CBC) 39.6  Platelet Count (CBC) 120  MCV 83  MCH 25.3  MCHC 30.4  RDW 23.8  Neutrophil % 87.8  Lymphocyte % 6.8  Monocyte % 3.5  Eosinophil % 1.0  Basophil % 0.9  Neutrophil # 12.9  Lymphocyte # 1.0  Monocyte # 0.5  Eosinophil # 0.1  Basophil # 0.1    10-Jan-16 03:40, Hemoglobin  Hemoglobin (CBC) 12.1  Result(s) reported on 04 Jul 2014 at 04:26AM.    10-Jan-16 03:40, Platelet Count  Platelet Count (CBC) 120  Result(s) reported on 04 Jul 2014 at 04:26AM.   RADIOLOGY:  Radiology Results: XRay:    17-May-15 19:52,  Chest Portable Single View  Chest Portable Single View  REASON FOR EXAM:    dyspnea  COMMENTS:       PROCEDURE: DXR - DXR PORTABLE CHEST SINGLE VIEW  - Nov 08 2013  7:52PM     CLINICAL DATA:  Dyspnea    EXAM:  PORTABLE CHEST - 1 VIEW    COMPARISON:  September 05, 2012    FINDINGS:  The mediastinal contour is normal. The heart size is enlarged. There  is no focal infiltrate. There is mild increased pulmonary  interstitium. There are minimal bilateral pleural effusions. The  aorta is tortuous. Both lungs are clear. The visualized skeletal  structures are unremarkable.     IMPRESSION:  Cardiomegaly with mild interstitial edema. Minimal bilateral pleural  effusions.      Electronically Signed    By: Abelardo Diesel M.D.    On: 11/08/2013 19:58  Verified By: Abelardo Diesel, M.D.,    20-May-15 19:38, KUB - Kidney Ureter Bladder  KUB - Kidney Ureter Bladder  REASON FOR EXAM:    left nephrolithiasis  COMMENTS:       PROCEDURE: DXR - DXR KIDNEY URETER BLADDER  - Nov 11 2013  7:38PM     CLINICAL DATA:  Left flank pain and nausea.    EXAM:  ABDOMEN - 1 VIEW    COMPARISON:  Renal ultrasound 11/10/2013.    FINDINGS:  As seen on ultrasound, there is a large staghorn calculus in the  left kidney. No other evidence of urinary tract stone is identified.  Sclerotic lesion in the right superior pubic bone measuring 1.3 cm  in diameter is identified. No other bony abnormality is identified.     IMPRESSION:  Large staghorn calculus left kidney.    Sclerotic lesion in the right superior pubic bone may be a bone  island. Recommend attention on follow-up examinations.      Electronically Signed    By: Inge Rise M.D.    On: 11/11/2013 19:43       Verified By: Ramond Dial, M.D.,    21-May-15 11:02, Barium Swallow Diagnostic  Barium Swallow Diagnostic  REASON FOR EXAM:    dysphagia-WITH BARIUM PILL  COMMENTS:       PROCEDURE: FL  - FL BARIUM SWALLOW  - Nov 12 2013 11:02AM     CLINICAL DATA:  Difficulty swallowing solids    EXAM:  ESOPHOGRAM/BARIUM SWALLOW    TECHNIQUE:  Single contrast examination was performed using thin barium. A 12 mm  barium pill was employed. The patient was very limited limited due  to multiple medical issues. She was able to tolerate drinking the  barium in the recumbent as well as semi recumbent positions but  could notstand. Interpretation of the study was delayed for  technical issues. Spot films are limited.    FLUOROSCOPY TIME:  0 min  24 seconds.    COMPARISON:  KUB of Nov 11, 2013    FINDINGS:  The patient ingested thin barium without difficulty. No laryngeal  penetration of barium was observed fluoroscopically. No cough reflex  was elicited. The thoracic esophagus distended well. The GE junction  was observed to relax fluoroscopically. No annular constricting  lesion was demonstrated. The patient was unable to initiate the  swallowing maneuver with the barium pill and spat it out.  There is a small right pleural effusion. The cardiopericardial  silhouette is enlarged. There is prominence of the central pulmonary  vascularity.     IMPRESSION:  No obstruction to passage of the barium through the esophagus into  the stomach. The barium pill could not be swallowed. The patient  voiced no chest discomfort during the study and no cough reflex was  elicited.    If the patient's swallowing symptoms (which are reportedly worse  with solids) persists, a modified barium swallow may be useful.      Electronically Signed    By: David  Martinique    On: 11/13/2013 07:52         Verified By: DAVID A. [place], M.D., MD    [phone] 22:02, Pelvis AP Only  Pelvis AP Only  REASON FOR EXAM:    fall., b/l hip pain  COMMENTS:       PROCEDURE: DXR - DXR PELVIS AP ONLY  - May 14 2014 10:02PM     CLINICAL DATA:  Bilateral hip pain, post fall  against a wall.    EXAM:  PELVIS - 1-2 VIEW    COMPARISON:   None.    FINDINGS:  Mildjoint space narrowing in the hip joints bilaterally. SI joints  are symmetric and unremarkable. Round sclerotic area in the right  pubic bone, likely bone island. No fracture, subluxation or  dislocation. Vascular calcifications noted without visibleaneurysm.     IMPRESSION:  No acute bony abnormality.      Electronically Signed    By: Rolm Baptise M.D.    On: 05/14/2014 22:05         Verified By: Raelyn Number, M.D.,    08-Jan-16 19:12, Chest Portable Single View  Chest Portable Single View  REASON FOR EXAM:    dyspnea  COMMENTS:       PROCEDURE: DXR - DXR PORTABLE CHEST SINGLE VIEW  - Jul 02 2014  7:12PM     CLINICAL DATA:  Dyspnea, cough    EXAM:  PORTABLE CHEST - 1 VIEW    COMPARISON:  11/08/2013    FINDINGS:  Chronic interstitial markings. No focal consolidation or frank  interstitial edema. Small bilateral pleural effusions. No  pneumothorax.    Cardiomegaly.     IMPRESSION:  Cardiomegaly.  No frank interstitial edema.    Small bilateral pleural effusions.      Electronically Signed    By: Julian Hy M.D.    On: 07/02/2014 19:44       Verified By: Julian Hy, M.D.,  [place]:    19-May-15 11:37, US Kidney Bilateral  US Kidney Bilateral  REASON FOR EXAM:    arf hydro  COMMENTS:       PROCEDURE: [place]  - US KIDNEY  - Nov 10 2013 11:37AM     CLINICAL DATA:  Hydronephrosis.    EXAM:  RENAL/URINARY TRACT ULTRASOUND COMPLETE    COMPARISON:  None.    FINDINGS:  Right Kidney:  Length: 10.0 cm.Echogenicity within normal limits. No mass or  hydronephrosis visualized.    Left Kidney:    Length: 8.1 cm. Echogenicity within normal limits. No mass or  hydronephrosis visualized. Prominent echogenic shadowing structures  present throughout the renal collecting consistent with staghorn  calculus.    Bladder:    Appears normal for degree of bladder distention.     IMPRESSION:  Staghorn calculus left  kidney.      Electronically Signed    By: Marcello Moores  Register    On: 11/10/2013 12:06         Verified By: Osa Craver, M.D., MD    08-Jan-16 21:34, [place] Color Flow Doppler Low Extrem Left (Leg)  [place] Color Flow Doppler Low Extrem Left (Leg)  REASON FOR EXAM:    left leg swelling and redness  COMMENTS:       PROCEDURE: [place]  - US DOPPLER LOW EXTR LEFT  - Jul 02 2014  9:34PM     CLINICAL DATA:  Left leg swelling and redness since this morning.  Edema and color changes. Anticoagulation therapy with 1 daily  aspirin.    EXAM:  Left LOWER EXTREMITY VENOUS DOPPLER ULTRASOUND    TECHNIQUE:  Gray-scale sonography with graded compression, as well as color  Doppler and duplex ultrasound were performed to evaluate the lower  extremity deep venoussystems from the level of the common femoral  vein and including the common femoral, femoral, profunda femoral,  popliteal and calf veins including the posterior tibial, peroneal  and gastrocnemius veins when visible. The superficial  great  saphenousvein was also interrogated. Spectral Doppler was utilized  to evaluate flow at rest and with distal augmentation maneuvers in  the common femoral, femoral and popliteal veins.    COMPARISON:  None.    FINDINGS:  Contralateral Common Femoral Vein: Respiratory phasicity is normal  and symmetric with the symptomatic side. No evidence of thrombus.  Normal compressibility.  Common Femoral Vein: No evidence of thrombus. Normal  compressibility, respiratory phasicity and response to augmentation.    Saphenofemoral Junction: No evidence of thrombus. Normal  compressibility and flow on color Doppler imaging.    Profunda Femoral Vein: No evidence of thrombus. Normal  compressibility and flow on color Doppler imaging.    Femoral Vein: No evidence of thrombus. Normal compressibility,  respiratory phasicity and response to augmentation.    Popliteal Vein: No evidence of thrombus. Normal  compressibility,  respiratory phasicity and response to augmentation.  Calf Veins: Calf vessels were not well visualized.    Superficial Great Saphenous Vein: No evidence of thrombus. Normal  compressibility and flow on color Doppler imaging.    Venous Reflux:  None.    Other Findings:  None.     IMPRESSION:  No evidence of deep venous thrombosis.      Electronically Signed    By: Lucienne Capers M.D.    On: 07/02/2014 21:42         Verified By: Neale Burly, M.D.,  [place]:    17-May-15 19:52, Chest Portable Single View  PACS Image    19-May-15 11:37, US Kidney Bilateral  PACS Image    20-May-15 19:38, KUB - Kidney Ureter Bladder  PACS Image    21-May-15 11:02, Barium Swallow Diagnostic  PACS Image    07-Aug-15 16:03, Kidney Function With Lasix (2 of 2)  PACS Image    20-Nov-15 21:09, CT Head Without Contrast  PACS Image    20-Nov-15 22:02, Pelvis AP Only  PACS Image    18-Dec-15 08:06, Wound Aerobic Culture  Ind. Clindamycin Resistance    08-Jan-16 19:12, Chest Portable Single View  PACS Image    08-Jan-16 21:34, [place] Color Flow Doppler Low Extrem Left (Leg)  PACS Image    09-Jan-16 18:48, Wound Aerobic Culture  Ind. Clindamycin Resistance  CT:    20-Nov-15 21:09, CT Head Without Contrast  CT Head Without Contrast  REASON FOR EXAM:    fall against wall, occiput hematoma. no LOC  COMMENTS:       PROCEDURE: CT  - CT HEAD WITHOUT CONTRAST  - May 14 2014  9:09PM     CLINICAL DATA:  Patient fell against a wall, striking head. Small  knot on the back of head.    EXAM:  CT HEAD WITHOUT CONTRAST    TECHNIQUE:  Contiguous axial images were obtained from the base of the skull  through the vertex without intravenous contrast.  COMPARISON:  None.    FINDINGS:  Mild cerebral atrophy. Patchy low-attenuation changes in the deep  white matter consistent with small vessel ischemia. No mass effect  or midline shift. No abnormal extra-axial fluid  collections.  Gray-white matter junctions are distinct. Basal cisterns are not  effaced. No evidence of acute intracranial hemorrhage. No depressed  skull fractures. Visualized paranasal sinuses and mastoid air cells  are not opacified. Vascular calcifications.     IMPRESSION:  No acute intracranial abnormalities. Chronic atrophy and small  vessel ischemic changes.  Electronically Signed    By: Lucienne Capers M.D.    On: 05/14/2014 21:14  Verified By: Neale Burly, M.D.,  Nuclear Med:    07-Aug-15 16:03, Kidney Function With Lasix (2 of 2)  Kidney Function With Lasix (2 of 2)  REASON FOR EXAM:    Dr Virginia Crews Fax [phone] Nephrolithiasis [address] Gaspar Cola [address] [address]...  COMMENTS:       PROCEDURE: NM  - NM  RENAL LASIX  2 24F 2  - Jan 29 2014  4:03PM     CLINICAL DATA:  74 year old female with a history of  nephrolithiasis.    EXAM:  NUCLEAR MEDICINE RENAL SCAN WITH DIURETIC ADMINISTRATION    TECHNIQUE:  Radionuclide angiographic and sequential renal images were obtained  after intravenous injection of radiopharmaceutical. Imaging was  continued during slow intravenous injection of Lasix approximately  15 minutes after the start of the examination.    RADIOPHARMACEUTICALS:  5.2 mCi Tc-75mMAG3    COMPARISON:  None.    FINDINGS:  Flow: Prompt arterial flow to the right kidney. Diminished and  delayed perfusionto the left kidney.    Left renogram: Moderate to markedly diminished and delayed cortical  uptake by the left kidney. Significantly diminished and delayed  excretion of the radiopharmaceutical. Insufficient accumulation of  tracer activity within the left renal collecting system to assess  clearance.    Right renogram: Normal cortical uptake by the right kidney. On the  delayed images there is normal excretion and clearance of the  radiopharmaceutical.    Differential:    Left kidney = 18.7 %    Right kidney = 81.3 %      IMPRESSION:    1. Normally functioning right kidney.  2. Diminished perfusion stress set diminished profusion an cortical  uptake by the left kidney. There is insufficient activity within the  left renal collecting system toassess clearance.  3. Split renal function is equal to 81.3% from the right kidney and  18.7% from the left kidney.      Electronically Signed    By: TKerby MoorsM.D.    On: 01/29/2014 16:25         Verified By: TAngelita Ingles M.D.,   ASSESSMENT AND PLAN:  Assessment/Admission Diagnosis BLE ulcerations, left on first MTP joint, right on heel.  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  Gravity of situation discussed with patient in detail.  High risk of limb loss present. Generally, would need angiography with possible revascularization on both LE in a staged fashion.  LLE appears worse, and would address this first.  Her severe CKD and dye allergy complicate this significantly.  Would hydrate for an extended period of time prior to procedure, and will need full allergy cocktail to avoid a dye reaction.  I have scheduled her angiogram for Thursday morning to allow for these pretreatment regimens.  Will also start Vitamin C and mucomyst today as these can help avoid nephrotoxicity a little as well.  If arterial duplex was available at our institution, may have been helpful to elucidate the location of lesions and save a small amount of contrast, but it is not available here.      level 5 consult   Electronic Signatures: DAlgernon Huxley(MD)  (Signed 11-Jan-16 16:49)  Authored: Chief Complaint and History, PAST MEDICAL/SURGICAL HISTORY, ALLERGIES, HOME MEDICATIONS, Family and Social History, Review of Systems, Physical Exam,  LABS, RADIOLOGY, Assessment and Plan   Last Updated: 11-Jan-16 16:49 by Algernon Huxley  (MD)

## 2015-02-18 IMAGING — CT CT HEAD WITHOUT CONTRAST
1 series · 16 of 30 positions shown, 20 images · non-contrast
Comparison: None.

CLINICAL DATA: Patient fell against a wall, striking head. Small
knot on the back of head.

EXAM:
CT HEAD WITHOUT CONTRAST
TECHNIQUE: Contiguous axial images were obtained from the base of the skull
through the vertex without intravenous contrast.

[Series 2: head wo · axial · 0.40mm/px · z∈[+653,+779]mm · 16 of 32 slices shown, 20 images]
[im 2/32  brain]
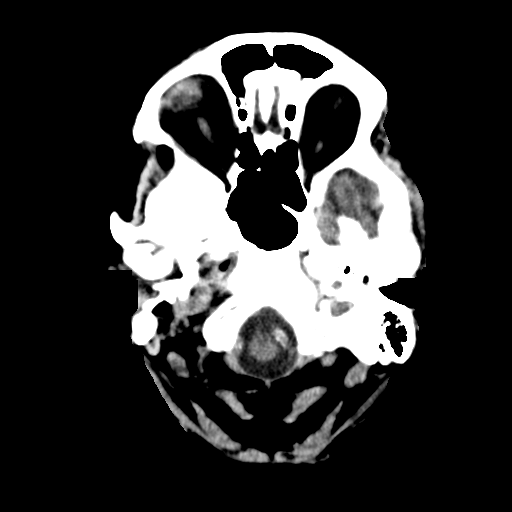
[im 2/32  bone]
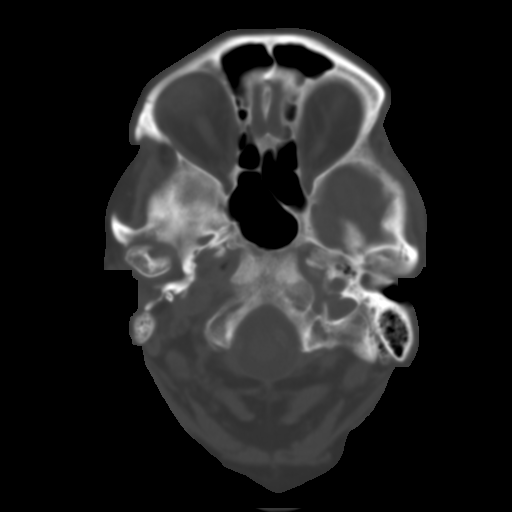
[im 4/32  brain]
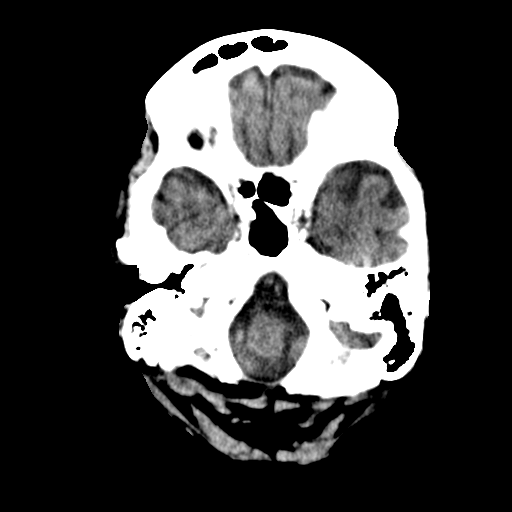
[im 6/32  brain]
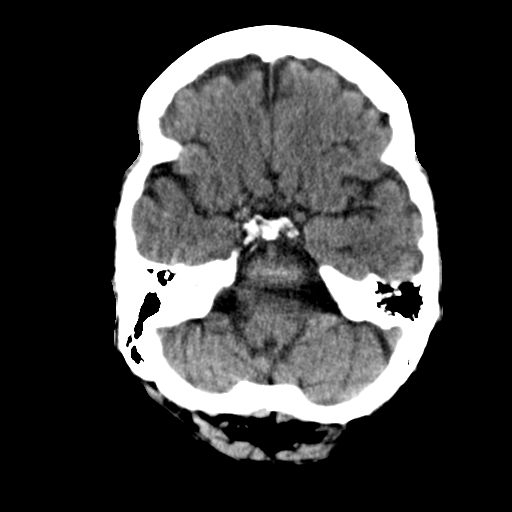
[im 8/32  brain]
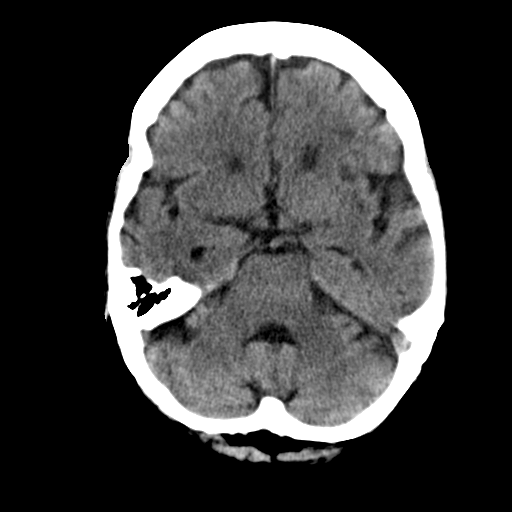
[im 9/32  brain]
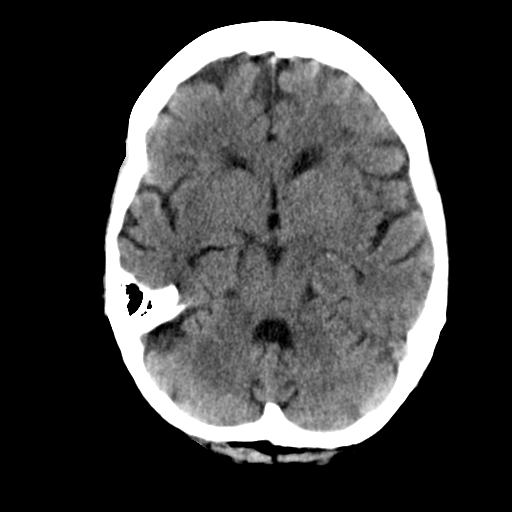
[im 9/32  bone]
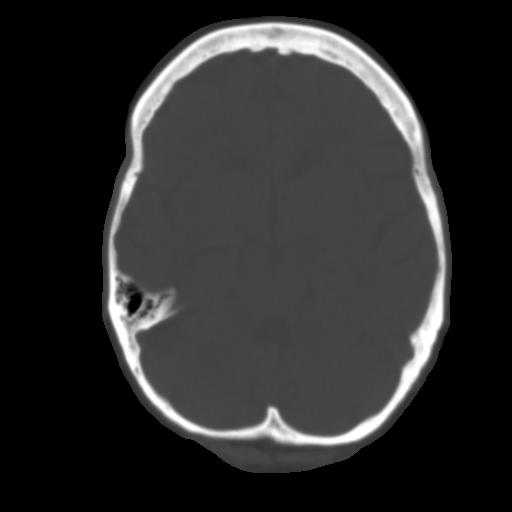
[im 11/32  brain]
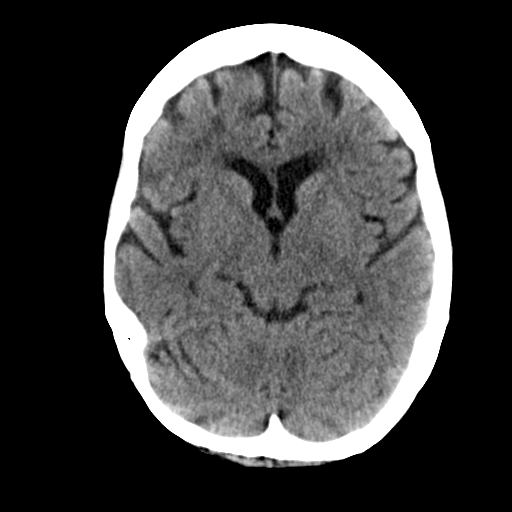
[im 13/32  brain]
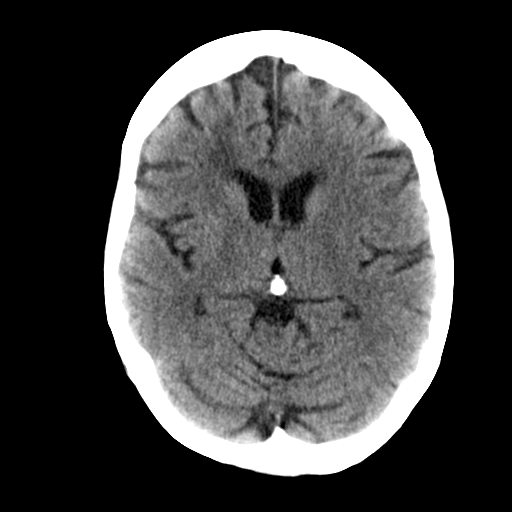
[im 15/32  brain]
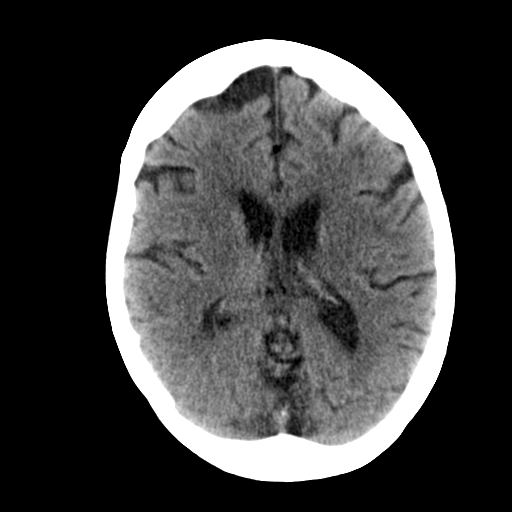
[im 17/32  brain]
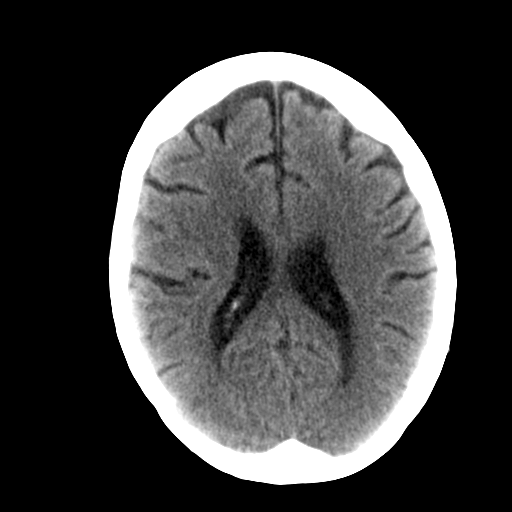
[im 17/32  bone]
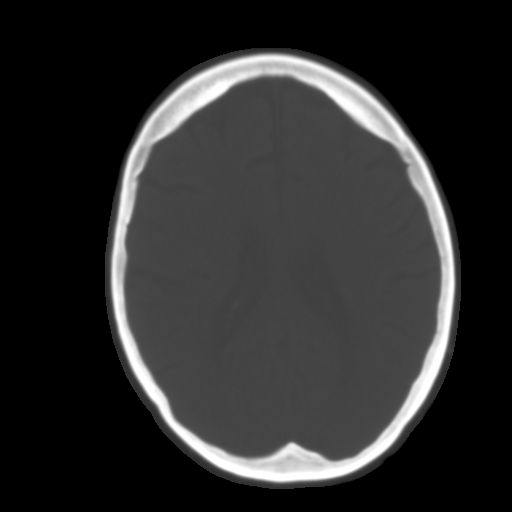
[im 19/32  brain]
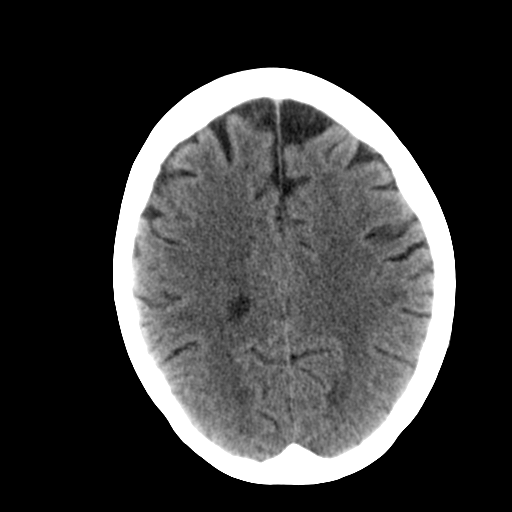
[im 21/32  brain]
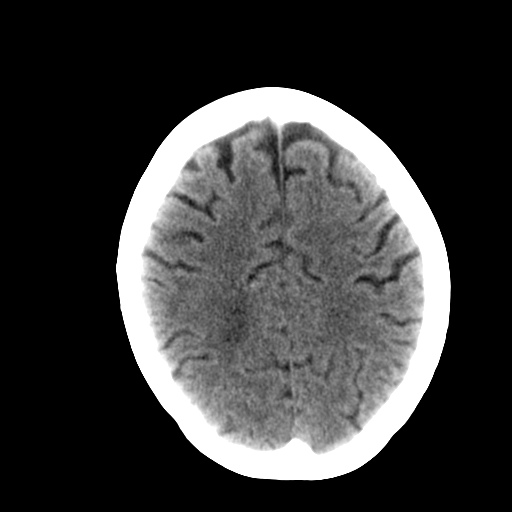
[im 23/32  brain]
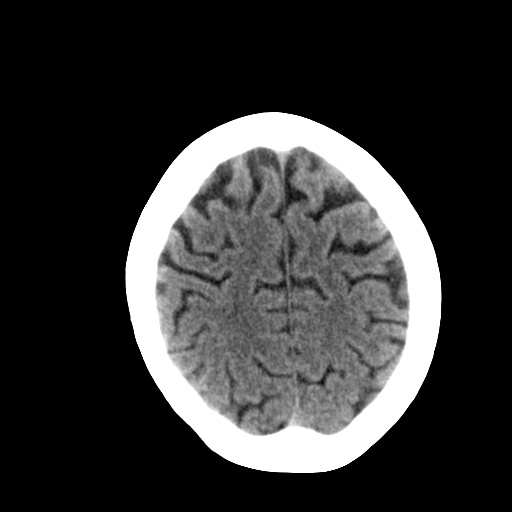
[im 24/32  brain]
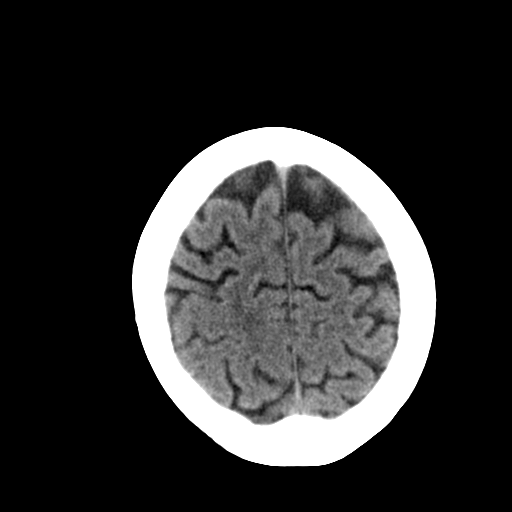
[im 24/32  bone]
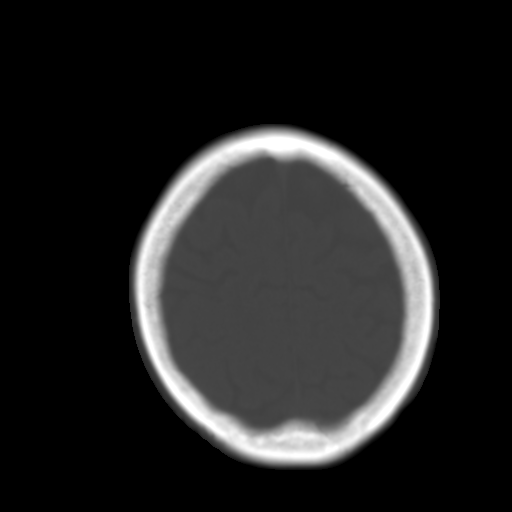
[im 26/32  brain]
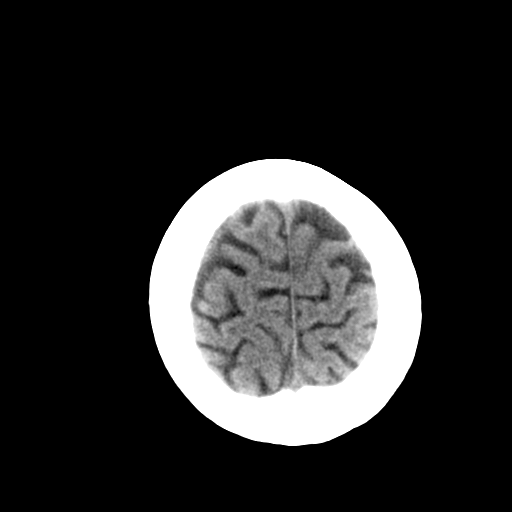
[im 28/32  brain]
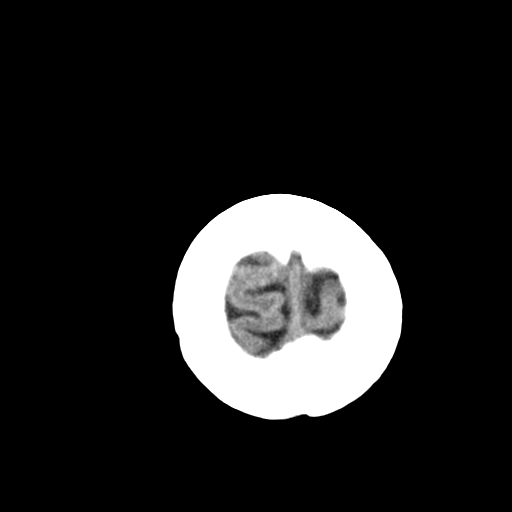
[im 30/32  brain]
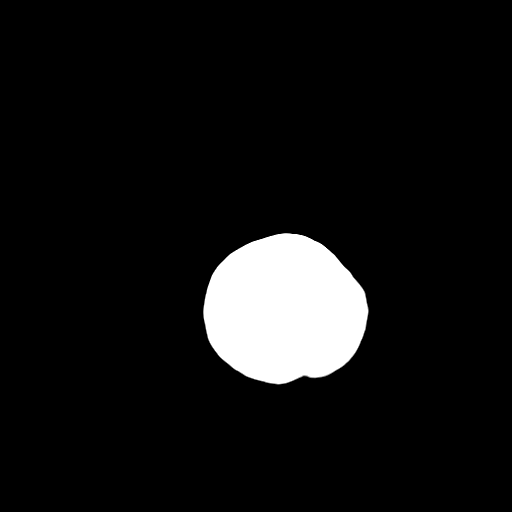

[16 of 30 positions shown; findings below may reference images not displayed]

FINDINGS: Mild cerebral atrophy. Patchy low-attenuation changes in the deep
white matter consistent with small vessel ischemia. No mass effect
or midline shift. No abnormal extra-axial fluid collections.
Gray-white matter junctions are distinct. Basal cisterns are not
effaced. No evidence of acute intracranial hemorrhage. No depressed
skull fractures. Visualized paranasal sinuses and mastoid air cells
are not opacified. Vascular calcifications.
IMPRESSION: No acute intracranial abnormalities. Chronic atrophy and small
vessel ischemic changes.

## 2015-04-08 IMAGING — US US EXTREM LOW VENOUS*L*
1 series · 13 of 24 positions shown · non-contrast
Comparison: None.

CLINICAL DATA: Left leg swelling and redness since this morning.
Edema and color changes. Anticoagulation therapy with 1 daily
aspirin.



[Series 1: us extrem low venous*left* · 0.06mm/px · 13 of 36 slices shown]
[im 1/36]
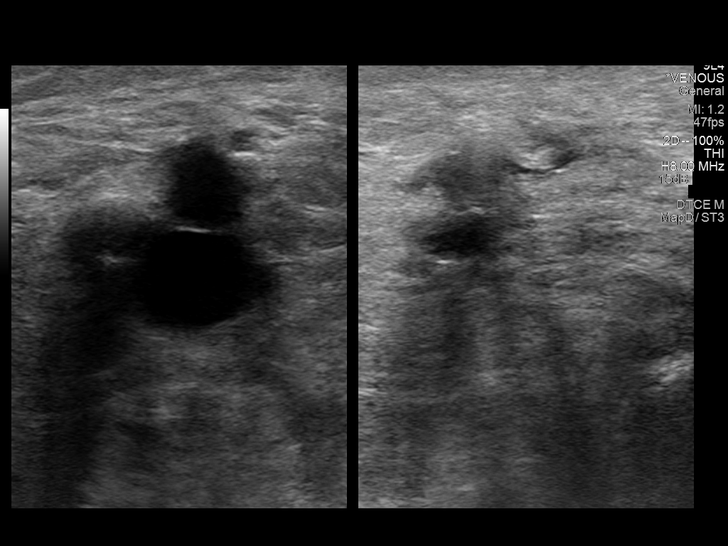
[im 4/36]
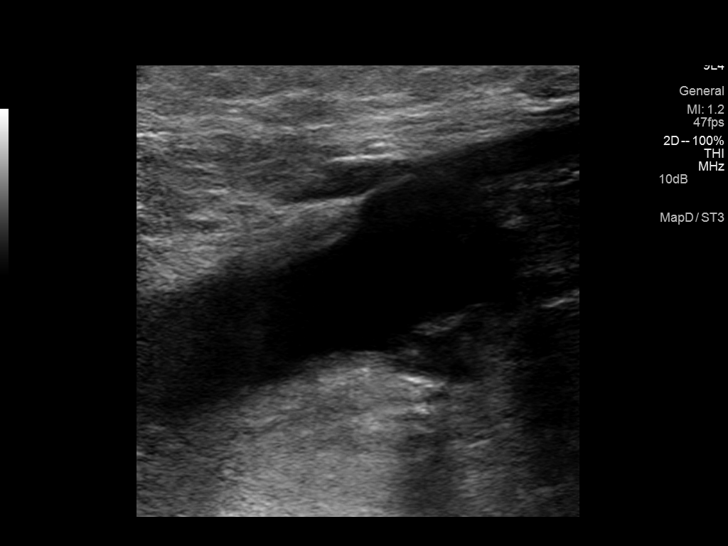
[im 7/36]
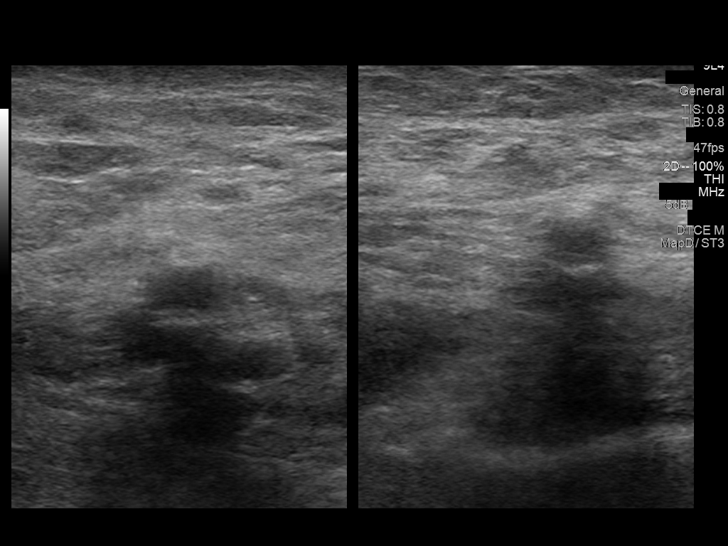
[im 10/36]
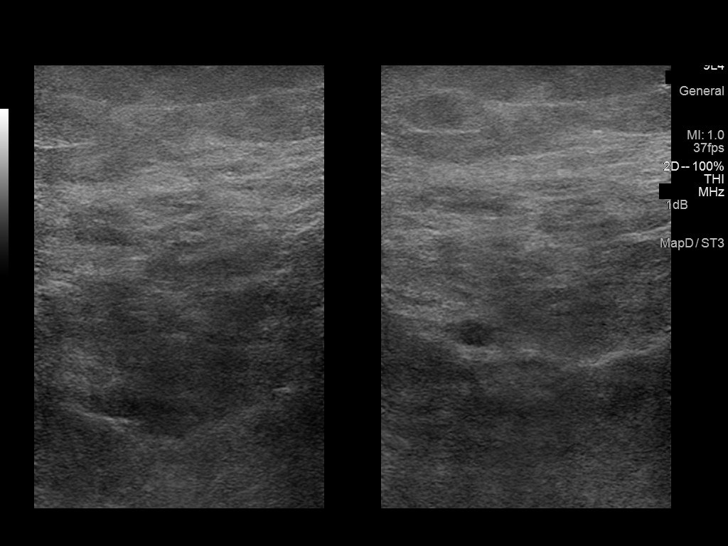
[im 13/36]
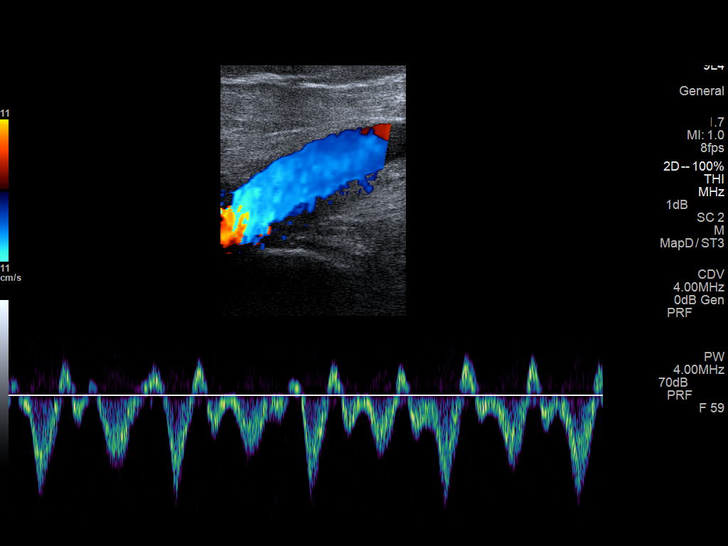
[im 16/36]
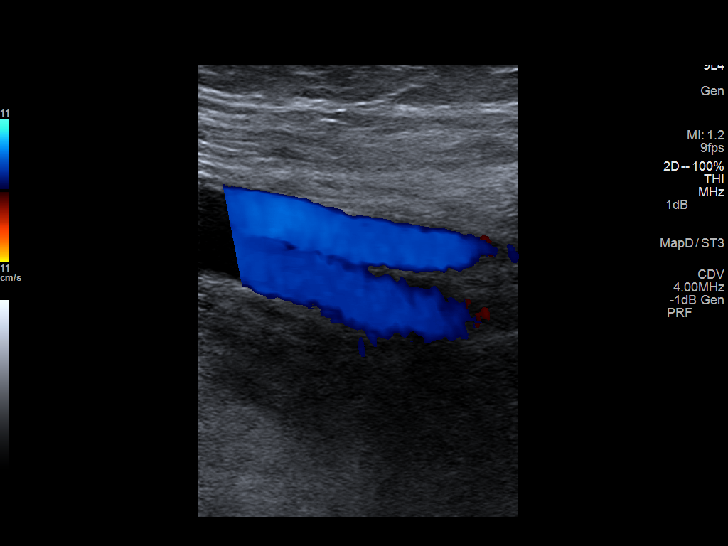
[im 19/36]
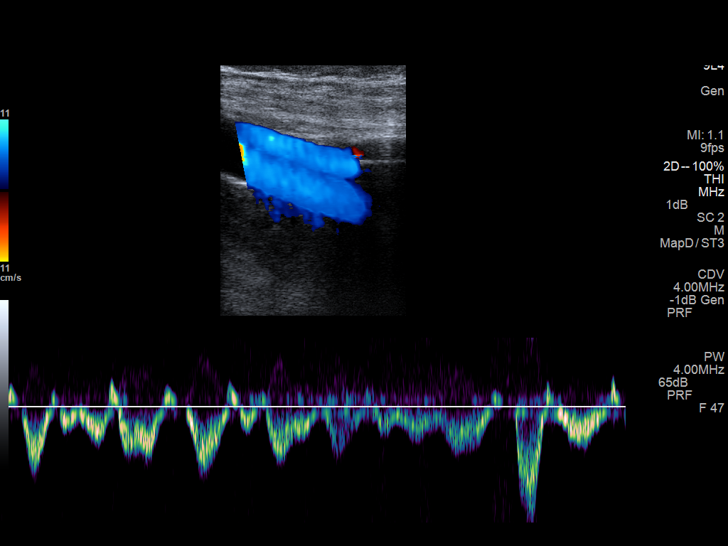
[im 20/36]
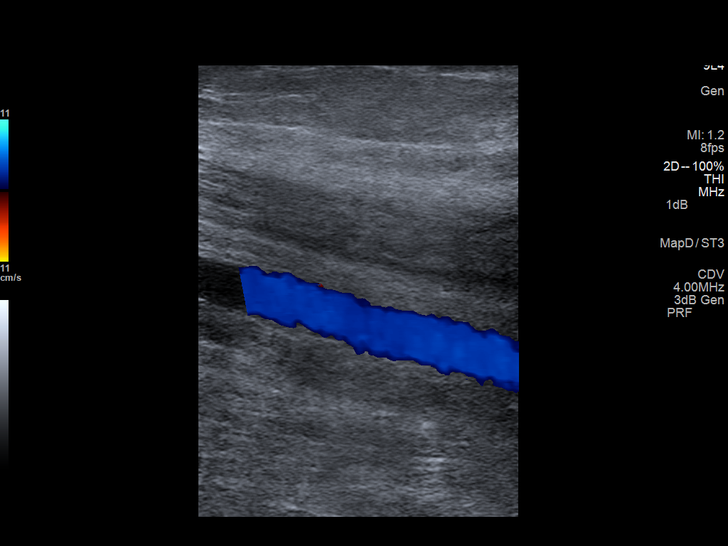
[im 23/36]
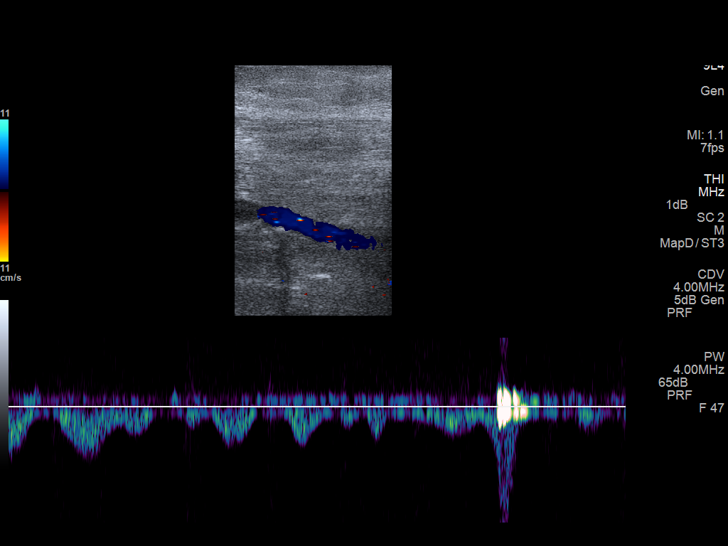
[im 26/36]
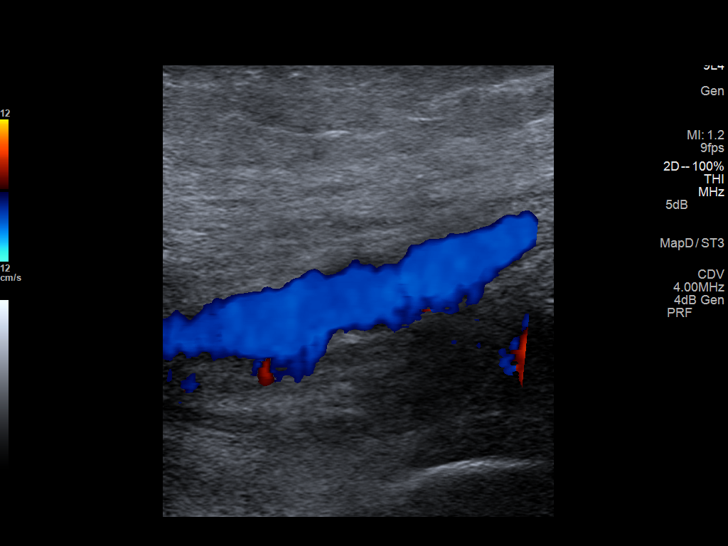
[im 29/36]
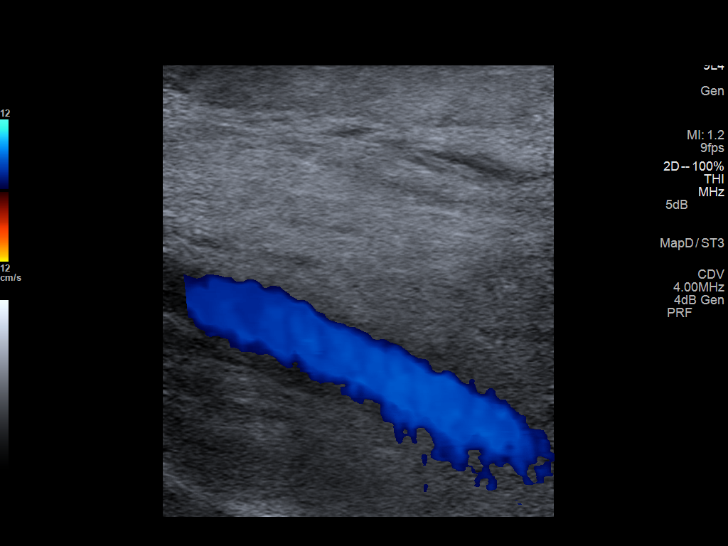
[im 32/36]
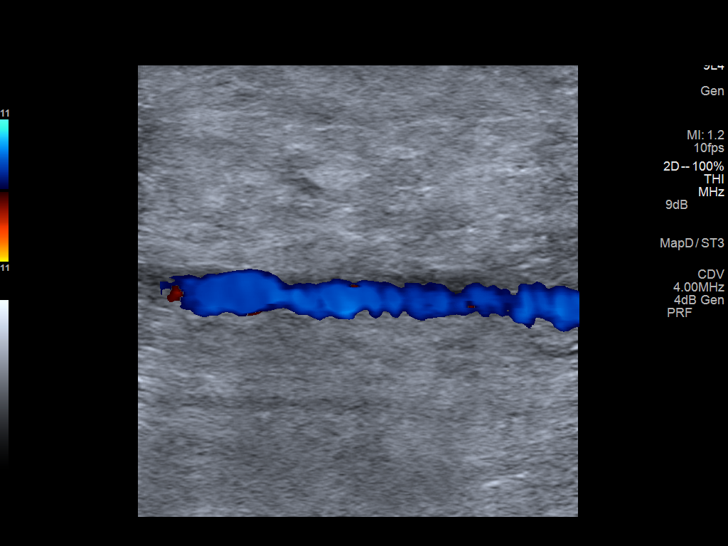
[im 36/36]
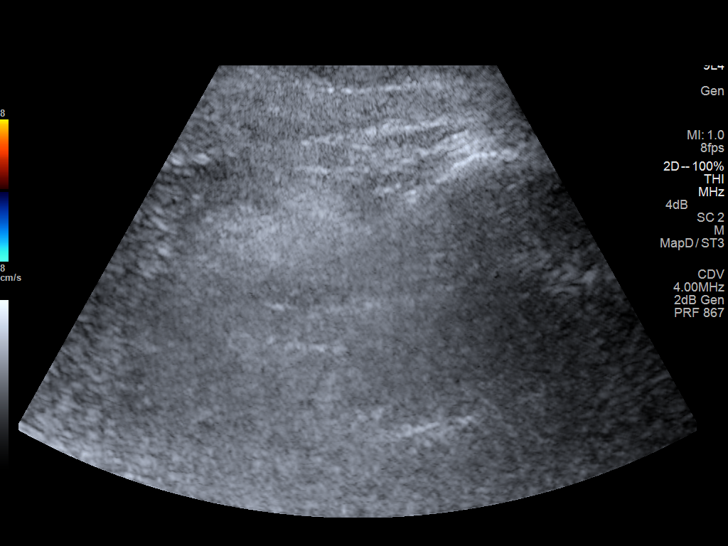

[13 of 24 positions shown; findings below may reference images not displayed]

FINDINGS: Contralateral Common Femoral Vein: Respiratory phasicity is normal
and symmetric with the symptomatic side. No evidence of thrombus.
Normal compressibility.

Common Femoral Vein: No evidence of thrombus. Normal
compressibility, respiratory phasicity and response to augmentation.

Saphenofemoral Junction: No evidence of thrombus. Normal
compressibility and flow on color Doppler imaging.

Profunda Femoral Vein: No evidence of thrombus. Normal
compressibility and flow on color Doppler imaging.

Femoral Vein: No evidence of thrombus. Normal compressibility,
respiratory phasicity and response to augmentation.

Popliteal Vein: No evidence of thrombus. Normal compressibility,
respiratory phasicity and response to augmentation.

Calf Veins: Calf vessels were not well visualized.

Superficial Great Saphenous Vein: No evidence of thrombus. Normal
compressibility and flow on color Doppler imaging.

Venous Reflux:  None.

Other Findings:  None.
IMPRESSION: No evidence of deep venous thrombosis.
